# Patient Record
Sex: Female | Born: 1950 | Race: White | Hispanic: No | Smoking: Never smoker
Health system: Southern US, Community
[De-identification: ages and names within clinical notes are randomized; demographics above are authoritative.]

## PROBLEM LIST (undated history)

## (undated) DIAGNOSIS — Z9889 Other specified postprocedural states: Secondary | ICD-10-CM

## (undated) DIAGNOSIS — M1990 Unspecified osteoarthritis, unspecified site: Secondary | ICD-10-CM

## (undated) DIAGNOSIS — F419 Anxiety disorder, unspecified: Secondary | ICD-10-CM

## (undated) DIAGNOSIS — R29818 Other symptoms and signs involving the nervous system: Secondary | ICD-10-CM

## (undated) DIAGNOSIS — M25552 Pain in left hip: Secondary | ICD-10-CM

## (undated) DIAGNOSIS — D649 Anemia, unspecified: Secondary | ICD-10-CM

## (undated) DIAGNOSIS — M25551 Pain in right hip: Secondary | ICD-10-CM

## (undated) DIAGNOSIS — E039 Hypothyroidism, unspecified: Secondary | ICD-10-CM

## (undated) DIAGNOSIS — Z87442 Personal history of urinary calculi: Secondary | ICD-10-CM

## (undated) DIAGNOSIS — I10 Essential (primary) hypertension: Secondary | ICD-10-CM

## (undated) DIAGNOSIS — F32A Depression, unspecified: Secondary | ICD-10-CM

## (undated) DIAGNOSIS — H269 Unspecified cataract: Secondary | ICD-10-CM

## (undated) DIAGNOSIS — M7032 Other bursitis of elbow, left elbow: Secondary | ICD-10-CM

## (undated) DIAGNOSIS — R7989 Other specified abnormal findings of blood chemistry: Secondary | ICD-10-CM

## (undated) DIAGNOSIS — C679 Malignant neoplasm of bladder, unspecified: Secondary | ICD-10-CM

## (undated) DIAGNOSIS — S82891A Other fracture of right lower leg, initial encounter for closed fracture: Secondary | ICD-10-CM

## (undated) DIAGNOSIS — R112 Nausea with vomiting, unspecified: Secondary | ICD-10-CM

## (undated) DIAGNOSIS — N390 Urinary tract infection, site not specified: Secondary | ICD-10-CM

## (undated) DIAGNOSIS — R519 Headache, unspecified: Secondary | ICD-10-CM

## (undated) DIAGNOSIS — G4700 Insomnia, unspecified: Secondary | ICD-10-CM

## (undated) DIAGNOSIS — M7070 Other bursitis of hip, unspecified hip: Secondary | ICD-10-CM

## (undated) DIAGNOSIS — F329 Major depressive disorder, single episode, unspecified: Secondary | ICD-10-CM

## (undated) HISTORY — DX: Anxiety disorder, unspecified: F41.9

## (undated) HISTORY — PX: BLADDER SURGERY: SHX569

## (undated) HISTORY — DX: Major depressive disorder, single episode, unspecified: F32.9

## (undated) HISTORY — PX: COLONOSCOPY: SHX174

## (undated) HISTORY — PX: CYSTOSCOPY: SUR368

## (undated) HISTORY — PX: VAGINAL HYSTERECTOMY: SUR661

## (undated) HISTORY — DX: Urinary tract infection, site not specified: N39.0

## (undated) HISTORY — DX: Depression, unspecified: F32.A

## (undated) HISTORY — DX: Essential (primary) hypertension: I10

---

## 1975-04-21 HISTORY — PX: APPENDECTOMY: SHX54

## 1980-04-20 HISTORY — PX: OVARIAN CYST REMOVAL: SHX89

## 2001-04-20 HISTORY — PX: FINGER SURGERY: SHX640

## 2003-04-21 HISTORY — PX: LASIK: SHX215

## 2010-06-11 ENCOUNTER — Other Ambulatory Visit (HOSPITAL_COMMUNITY)
Admission: RE | Admit: 2010-06-11 | Discharge: 2010-06-11 | Disposition: A | Discharge status: Home / Self Care | Payer: BC Managed Care – PPO | Source: Ambulatory Visit | Attending: Family Medicine | Admitting: Family Medicine

## 2010-06-11 DIAGNOSIS — Z0000 Encounter for general adult medical examination without abnormal findings: Secondary | ICD-10-CM | POA: Insufficient documentation

## 2010-11-11 ENCOUNTER — Ambulatory Visit (HOSPITAL_BASED_OUTPATIENT_CLINIC_OR_DEPARTMENT_OTHER)
Admission: RE | Admit: 2010-11-11 | Discharge: 2010-11-11 | Disposition: A | Discharge status: Home / Self Care | Payer: BC Managed Care – PPO | Source: Ambulatory Visit | Attending: Urology | Admitting: Urology

## 2010-11-11 ENCOUNTER — Other Ambulatory Visit: Payer: Self-pay | Admitting: Urology

## 2010-11-11 DIAGNOSIS — C672 Malignant neoplasm of lateral wall of bladder: Secondary | ICD-10-CM | POA: Insufficient documentation

## 2010-11-11 DIAGNOSIS — Z01812 Encounter for preprocedural laboratory examination: Secondary | ICD-10-CM | POA: Insufficient documentation

## 2010-11-11 DIAGNOSIS — Z01810 Encounter for preprocedural cardiovascular examination: Secondary | ICD-10-CM | POA: Insufficient documentation

## 2010-11-11 LAB — POCT I-STAT 4, (NA,K, GLUC, HGB,HCT)
Glucose, Bld: 101 mg/dL — ABNORMAL HIGH (ref 70–99)
HCT: 39 % (ref 36.0–46.0)
Sodium: 141 mEq/L (ref 135–145)

## 2010-12-03 NOTE — Op Note (Signed)
  Sheri Mueller, Sheri Mueller                 ACCOUNT NO.:  [id number]  MEDICAL RECORD NO.:  [id number]  LOCATION:                                 FACILITY:  PHYSICIAN:  Danae Chen, M.D.  DATE OF BIRTH:  1950/06/09  DATE OF PROCEDURE:  11/11/2010 DATE OF DISCHARGE:                              OPERATIVE REPORT   PREOPERATIVE DIAGNOSIS:  Bladder tumor.  POSTOPERATIVE DIAGNOSIS:  Bladder tumor.  PROCEDURE:  Cystoscopy, transurethral resection of bladder tumor.  SURGEON:  Danae Chen, MD  ANESTHESIA:  General.  INDICATION:  The patient is a 60 year old female who had gross painless hematuria about 6 weeks ago.  The CT scan showed bilateral renal calculi.  Cystoscopy showed a small papillary tumor on the left lateral wall of the bladder.  She gave a history of benign bladder tumor several years ago.  She is scheduled today for cystoscopy and TUR bladder tumor.  DESCRIPTION OF PROCEDURE:  The patient was identified by her wrist band and proper time-out was taken.  Under general anesthesia, she was prepped and draped and placed in the dorsal lithotomy position.  A panendoscope was inserted in the bladder. There was a small tumor that measures about 2 cm on the left lateral wall of the bladder, lateral to the left ureteral orifice.  There is no evidence of other tumor in the bladder.  The ureteral orifices are in normal position and shape.  The cystoscope was removed.  A resectoscope was inserted in the bladder and resection of the bladder tumor was done. The tumor appears to be superficial.  Resection of the base of the bladder tumor was also done.  The specimen was then removed out of the bladder.  The margin of resection was also fulgurated as well as the base of the bladder tumor.  There was no evidence of bleeding at the end of the procedure.  The resectoscope was then removed.  A #20 French Foley catheter was inserted in the bladder.  The patient tolerated the procedure well and  left the OR in satisfactory condition to postanesthesia care unit.     Danae Chen, M.D.     MN/MEDQ  D:  11/11/2010  T:  11/11/2010  Job:  [id number]  cc:   Dario Guardian, M.D. Fax: [phone]  Electronically Signed by Lindaann Slough M.D. on 12/03/2010 11:27:16 AM

## 2012-08-09 ENCOUNTER — Encounter: Payer: Self-pay | Admitting: Diagnostic Neuroimaging

## 2012-08-09 ENCOUNTER — Ambulatory Visit (INDEPENDENT_AMBULATORY_CARE_PROVIDER_SITE_OTHER): Payer: BC Managed Care – PPO | Admitting: Diagnostic Neuroimaging

## 2012-08-09 VITALS — BP 126/68 | HR 76 | Temp 98.0°F | Ht 66.0 in | Wt 144.5 lb

## 2012-08-09 DIAGNOSIS — R6889 Other general symptoms and signs: Secondary | ICD-10-CM

## 2012-08-09 DIAGNOSIS — L738 Other specified follicular disorders: Secondary | ICD-10-CM

## 2012-08-09 DIAGNOSIS — R209 Unspecified disturbances of skin sensation: Secondary | ICD-10-CM

## 2012-08-09 DIAGNOSIS — L853 Xerosis cutis: Secondary | ICD-10-CM

## 2012-08-09 NOTE — Progress Notes (Signed)
[facility]  PATIENT: Sheri Mueller DOB: Oct 17, 1950  REFERRING CLINICIAN: C Smaith HISTORY FROM: patient REASON FOR VISIT: new consult   HISTORICAL  CHIEF COMPLAINT:  Chief Complaint  Patient presents with  . Numbness    skin    HISTORY OF PRESENT ILLNESS:   62 year old right-handed female with prior history of hypertension, UTIs, prior depression anxiety, bladder cancer, here for evaluation of dry skin and low body temperature.  2 years ago patient received laser skin treatment, and developed abnormal marks on her skin. One year ago patient was at a massage therapist, who noted that the patient had dry skin and muscle tension. Patient also had some pain in her groin and hip regions.  By Christmas of 2013, patient was having abnormal sensations in her face with twisting and pulling sensation. Initially this was continuous, but now has become intermittent lasting for minutes or hours at a time.  Patient has seen endocrinology, had extensive blood testing without a specific diagnosis. Patient also noted to have low temperatures at home with a mercury thermometer orally.  REVIEW OF SYSTEMS: Full 14 system review of systems performed and notable only for fatigue, ringing in the ears, rash, itching, feeling cold, joint pain, not enough sleep, insomnia.  ALLERGIES: No Known Allergies  HOME MEDICATIONS: No outpatient prescriptions prior to visit.   No facility-administered medications prior to visit.    PAST MEDICAL HISTORY: Past Medical History  Diagnosis Date  . Urinary tract infection   . Hypertension   . Anxiety and depression   . Cancer     Bladder    PAST SURGICAL HISTORY: Past Surgical History  Procedure Laterality Date  . Appendectomy  1977  . Ovarian cyst removal  1982  . Vaginal hysterectomy  1990's  . Finger surgery  2003    dupuytren's contracture  . Bladder surgery      tumor removal    FAMILY HISTORY: Family History  Problem  Relation Age of Onset  . Congestive Heart Failure Maternal Grandmother   . Congestive Heart Failure Mother     SOCIAL HISTORY:  History   Social History  . Marital Status: Divorced    Spouse Name: N/A    Number of Children: 2  . Years of Education: college   Occupational History  . caregiver    Social History Main Topics  . Smoking status: Never Smoker   . Smokeless tobacco: Not on file  . Alcohol Use: Yes     Comment: 1 glass of red wine nightly  . Drug Use: No  . Sexually Active: Not on file   Other Topics Concern  . Not on file   Social History Narrative   Pt lives at home alone.    Caffeine Use- 1 cup in a.m. (1/2 caffeinated)     PHYSICAL EXAM  Filed Vitals:   08/09/12 1008  BP: 126/68  Pulse: 76  Temp: 98 F (36.7 C)  TempSrc: Oral  Height: 5\' 6"  (1.676 m)  Weight: 144 lb 8 oz (65.545 kg)   Body mass index is 23.33 kg/(m^2).  GENERAL EXAM: Patient is in no distress  CARDIOVASCULAR: Regular rate and rhythm, no murmurs, no carotid bruits  NEUROLOGIC: MENTAL STATUS: awake, alert, language fluent, comprehension intact, naming intact CRANIAL NERVE: no papilledema on fundoscopic exam, pupils equal and reactive to light, visual fields full to confrontation, extraocular muscles intact, no nystagmus, facial sensation and strength symmetric, uvula midline, shoulder shrug symmetric, tongue midline. MOTOR: normal bulk and tone,  full strength in the BUE, BLE SENSORY: normal and symmetric to light touch, pinprick, temperature, vibration and proprioception COORDINATION: finger-nose-finger, fine finger movements normal REFLEXES: deep tendon reflexes present and symmetric; TRACE AT ANKLES. GAIT/STATION: narrow based gait; able to walk on toes, heels and tandem; romberg is negative   DIAGNOSTIC DATA (LABS, IMAGING, TESTING) - I reviewed patient records, labs, notes, testing and imaging myself where available.  Lab Results  Component Value Date   HGB 13.3  11/11/2010   HCT 39.0 11/11/2010      Component Value Date/Time   NA 141 11/11/2010 0907   K 3.4* 11/11/2010 0907   GLUCOSE 101* 11/11/2010 0907   No results found for this basename: CHOL, HDL, LDLCALC, LDLDIRECT, TRIG, CHOLHDL   No results found for this basename: HGBA1C   No results found for this basename: VITAMINB12   No results found for this basename: TSH    SSA/SSB - < 0.2 ESR 6 FT4 0.85 TSH 2.70   ASSESSMENT AND PLAN  62 y.o. year old female  has a past medical history of Urinary tract infection; Hypertension; Anxiety and depression; and Cancer. here with dry skin and low temperature / cold intolerance. Unclear neurologic etiology. I would recommend MRI brain to rule out intracranial abnormality (inflammation, pituitary lesion, vascular). She wants to hold off for now. Also should check B12 level, which can be done at PCP office.  PLAN: 1. MRI brain 2. B12 level     Suanne Marker, MD 08/09/2012, 11:15 AM Certified in Neurology, Neurophysiology and Neuroimaging  [facility] [address], [address] [place], [place] [address] [phone]

## 2012-08-09 NOTE — Patient Instructions (Addendum)
I recommend checking MRI brain and B12 level. Let us know if you want to proceed with MRI brain. Your PCP can check B12 level at next lab draw.

## 2012-12-06 ENCOUNTER — Other Ambulatory Visit: Payer: Self-pay | Admitting: Gastroenterology

## 2013-03-30 ENCOUNTER — Telehealth: Payer: Self-pay | Admitting: Oncology

## 2013-03-30 NOTE — Telephone Encounter (Signed)
C/D 03/30/13 for appt. 05/03/12

## 2013-03-30 NOTE — Telephone Encounter (Signed)
S/W PT AND GVE NP APPT 1/14 @ 1:30 W/DR. SHADAD REFERRING DR. Merri Brunette  DX-INC'D FERRITIN WELCOME PACKET MAILED.

## 2013-03-30 NOTE — Telephone Encounter (Signed)
LVOM FOR PT TO RETURN CALL IN RE TO REFERRAL.  °

## 2013-04-28 ENCOUNTER — Other Ambulatory Visit: Payer: Self-pay | Admitting: Oncology

## 2013-05-03 ENCOUNTER — Encounter: Payer: Self-pay | Admitting: Oncology

## 2013-05-03 ENCOUNTER — Other Ambulatory Visit (HOSPITAL_BASED_OUTPATIENT_CLINIC_OR_DEPARTMENT_OTHER): Payer: BC Managed Care – PPO

## 2013-05-03 ENCOUNTER — Ambulatory Visit (HOSPITAL_BASED_OUTPATIENT_CLINIC_OR_DEPARTMENT_OTHER): Payer: BC Managed Care – PPO | Admitting: Oncology

## 2013-05-03 ENCOUNTER — Ambulatory Visit (HOSPITAL_BASED_OUTPATIENT_CLINIC_OR_DEPARTMENT_OTHER): Payer: BC Managed Care – PPO

## 2013-05-03 DIAGNOSIS — M6289 Other specified disorders of muscle: Secondary | ICD-10-CM

## 2013-05-03 DIAGNOSIS — I10 Essential (primary) hypertension: Secondary | ICD-10-CM

## 2013-05-03 DIAGNOSIS — R234 Changes in skin texture: Secondary | ICD-10-CM

## 2013-05-03 LAB — CBC WITH DIFFERENTIAL/PLATELET
BASO%: 0.6 % (ref 0.0–2.0)
Basophils Absolute: 0.1 10*3/uL (ref 0.0–0.1)
EOS%: 2.7 % (ref 0.0–7.0)
Eosinophils Absolute: 0.2 10*3/uL (ref 0.0–0.5)
HEMATOCRIT: 40.3 % (ref 34.8–46.6)
HGB: 13.6 g/dL (ref 11.6–15.9)
LYMPH#: 1.8 10*3/uL (ref 0.9–3.3)
LYMPH%: 19.6 % (ref 14.0–49.7)
MCH: 29.1 pg (ref 25.1–34.0)
MCHC: 33.7 g/dL (ref 31.5–36.0)
MCV: 86.4 fL (ref 79.5–101.0)
MONO#: 0.5 10*3/uL (ref 0.1–0.9)
MONO%: 6 % (ref 0.0–14.0)
NEUT#: 6.6 10*3/uL — ABNORMAL HIGH (ref 1.5–6.5)
NEUT%: 71.1 % (ref 38.4–76.8)
Platelets: 221 10*3/uL (ref 145–400)
RBC: 4.66 10*6/uL (ref 3.70–5.45)
RDW: 14.1 % (ref 11.2–14.5)
WBC: 9.2 10*3/uL (ref 3.9–10.3)

## 2013-05-03 LAB — COMPREHENSIVE METABOLIC PANEL (CC13)
ALBUMIN: 3.8 g/dL (ref 3.5–5.0)
ALT: 16 U/L (ref 0–55)
ANION GAP: 8 meq/L (ref 3–11)
AST: 19 U/L (ref 5–34)
Alkaline Phosphatase: 53 U/L (ref 40–150)
BUN: 16.4 mg/dL (ref 7.0–26.0)
CALCIUM: 9.1 mg/dL (ref 8.4–10.4)
CHLORIDE: 106 meq/L (ref 98–109)
CO2: 25 meq/L (ref 22–29)
CREATININE: 0.7 mg/dL (ref 0.6–1.1)
Glucose: 87 mg/dl (ref 70–140)
POTASSIUM: 4.2 meq/L (ref 3.5–5.1)
SODIUM: 140 meq/L (ref 136–145)
TOTAL PROTEIN: 6.6 g/dL (ref 6.4–8.3)
Total Bilirubin: 0.41 mg/dL (ref 0.20–1.20)

## 2013-05-03 LAB — FERRITIN CHCC: Ferritin: 37 ng/ml (ref 9–269)

## 2013-05-03 LAB — IRON AND TIBC CHCC
%SAT: 23 % (ref 21–57)
Iron: 79 ug/dL (ref 41–142)
TIBC: 344 ug/dL (ref 236–444)
UIBC: 265 ug/dL (ref 120–384)

## 2013-05-03 NOTE — Consult Note (Signed)
Reason for Referral: Elevated ferritin.   HPI: 63 year old woman native of [place] nephrectomy for the evaluation of elevated ferritin. She is a pleasant woman who has been rather healthy for the majority of her adult life. In the last year or so she have noticed symptoms of muscle stiffness and skin changes which prompted that laboratory testing that was done in August of 2014. It was noted that she had an elevated ferritin of 190. She had normal hemoglobin and hematocrit. She had normal platelet count and white cell count. She had normal liver function tests and electrolytes. She has a brother that was diagnosed with hemochromatosis and currently not receiving phlebotomies but have in the past. She reports no other complications or symptoms at this time. She had donated blood on a few occasions and relatively asymptomatic. She has not reported any weakness fatigue or tiredness. Has not reported any limitation in her blood sugar or mood changes. She has reported right hip pain which is have been that was with physical therapy. She remains very active and cares for her elderly parents.   Past Medical History  Diagnosis Date  . Urinary tract infection   . Hypertension   . Anxiety and depression   . Cancer     Bladder  :  Past Surgical History  Procedure Laterality Date  . Appendectomy  1977  . Ovarian cyst removal  1982  . Vaginal hysterectomy  1990's  . Finger surgery  2003    dupuytren's contracture  . Bladder surgery      tumor removal  : Current Outpatient Prescriptions  Medication Sig Dispense Refill  . losartan-hydrochlorothiazide (HYZAAR) 100-25 MG per tablet Take 1 tablet by mouth daily.      Marland Kitchen NATURE-THROID 97.5 MG TABS Take 1 tablet by mouth daily.       No current facility-administered medications for this visit.    No Known Allergies:  Family History  Problem Relation Age of Onset  . Congestive Heart Failure Maternal Grandmother   . Congestive Heart Failure Mother    :  History   Social History  . Marital Status: Divorced    Spouse Name: N/A    Number of Children: 2  . Years of Education: college   Occupational History  . caregiver    Social History Main Topics  . Smoking status: Never Smoker   . Smokeless tobacco: Not on file  . Alcohol Use: Yes     Comment: 1 glass of red wine nightly  . Drug Use: No  . Sexual Activity: Not on file   Other Topics Concern  . Not on file   Social History Narrative   Pt lives at home alone.    Caffeine Use- 1 cup in a.m. (1/2 caffeinated)  :  Constitutional: negative for anorexia, chills and fatigue Eyes: negative for icterus, irritation and redness Ears, nose, mouth, throat, and face: negative for epistaxis, hoarseness and nasal congestion Respiratory: negative for asthma, cough and dyspnea on exertion Cardiovascular: negative for chest pain, chest pressure/discomfort and exertional chest pressure/discomfort Gastrointestinal: negative for abdominal pain, diarrhea and jaundice Genitourinary:negative for frequency, hematuria and nocturia Integument/breast: negative for pruritus and rash Hematologic/lymphatic: negative for bleeding, easy bruising and lymphadenopathy Musculoskeletal:negative for arthralgias Neurological: negative for coordination problems, dizziness and gait problems Behavioral/Psych: negative for depression Endocrine: No heat or cold intolerance.   Exam: ECOG 0 Blood pressure 150/54, pulse 82, temperature 98 F (36.7 C), temperature source Oral, resp. rate 18, height 5\' 6"  (1.676 m), weight  147 lb (66.679 kg). General appearance: alert, cooperative and appears stated age Head: Normocephalic, without obvious abnormality, atraumatic Nose: Nares normal. Septum midline. Mucosa normal. No drainage or sinus tenderness. Throat: lips, mucosa, and tongue normal; teeth and gums normal Neck: no adenopathy, no carotid bruit, no JVD, supple, symmetrical, trachea midline and thyroid not  enlarged, symmetric, no tenderness/mass/nodules Back: symmetric, no curvature. ROM normal. No CVA tenderness. Resp: clear to auscultation bilaterally Chest wall: no tenderness Cardio: regular rate and rhythm, S1, S2 normal, no murmur, click, rub or gallop GI: soft, non-tender; bowel sounds normal; no masses,  no organomegaly Extremities: extremities normal, atraumatic, no cyanosis or edema Pulses: 2+ and symmetric Skin: Skin color, texture, turgor normal. No rashes or lesions Lymph nodes: Cervical, supraclavicular, and axillary nodes normal. Neurologic: Grossly normal   Recent Labs  05/03/13 1358  WBC 9.2  HGB 13.6  HCT 40.3  PLT 221     Assessment and Plan:   63 year old woman with the following issues:  1. Elevated ferritin of 190 with the upper limit of normal of 150. The differential diagnosis was discussed today with the patient in detail. Elevated ferritin can be due to chronic or acute inflammatory conditions could also be due to dietary intake could also be a sign of iron metabolism. She could have a milder form of hemochromatosis that could be manifesting itself with the elevated ferritin. Given the fact that she is asymptomatic without any liver, cardiac or hematological abnormalities I see no clear-cut need for any intervention. I will repeat her ferritin level and certainly if her ferritin continue to rise we will consider checking her for hemochromatosis genetic testing. Unless she develops continuous rise in her ferritin and endorgan damage I see no reason to put her on any routine phlebotomies. I continue to encourage her to donate blood as she is doing.  2. Skin changes and muscle stiffness: I doubt this is related to her elevated ferritin given the fact that her ferritin levels are relatively mild and do not constitute or explain her symptoms at this time. I explained to her that she might need an evaluation by rheumatology for possible autoimmune disorder or  fibromyalgia.

## 2013-05-03 NOTE — Progress Notes (Signed)
Please see consult note.  

## 2013-05-03 NOTE — Progress Notes (Signed)
Mailed updated medication list to patient's home 

## 2013-05-04 ENCOUNTER — Telehealth: Payer: Self-pay | Admitting: *Deleted

## 2013-05-04 NOTE — Telephone Encounter (Signed)
Spoke with patient, re: labs and mailed copy to her home.

## 2013-05-04 NOTE — Telephone Encounter (Signed)
Message copied by Randolm Idol on Thu May 04, 2013  8:50 AM ------      Message from: Wyatt Portela      Created: Wed May 03, 2013  3:28 PM       Please call her Ferritin level ([phone]). Normal and no further testing is needed. ------

## 2013-06-20 DIAGNOSIS — N951 Menopausal and female climacteric states: Secondary | ICD-10-CM | POA: Diagnosis not present

## 2013-06-20 DIAGNOSIS — E039 Hypothyroidism, unspecified: Secondary | ICD-10-CM | POA: Diagnosis not present

## 2013-06-28 DIAGNOSIS — J305 Allergic rhinitis due to food: Secondary | ICD-10-CM | POA: Diagnosis not present

## 2013-06-28 DIAGNOSIS — T7809XA Anaphylactic reaction due to other food products, initial encounter: Secondary | ICD-10-CM | POA: Diagnosis not present

## 2013-06-28 DIAGNOSIS — K5229 Other allergic and dietetic gastroenteritis and colitis: Secondary | ICD-10-CM | POA: Diagnosis not present

## 2013-08-02 DIAGNOSIS — M79609 Pain in unspecified limb: Secondary | ICD-10-CM | POA: Diagnosis not present

## 2013-08-02 DIAGNOSIS — R5383 Other fatigue: Secondary | ICD-10-CM | POA: Diagnosis not present

## 2013-08-02 DIAGNOSIS — R5381 Other malaise: Secondary | ICD-10-CM | POA: Diagnosis not present

## 2013-08-10 DIAGNOSIS — M79609 Pain in unspecified limb: Secondary | ICD-10-CM | POA: Diagnosis not present

## 2013-08-16 DIAGNOSIS — M79609 Pain in unspecified limb: Secondary | ICD-10-CM | POA: Diagnosis not present

## 2013-08-31 DIAGNOSIS — M79609 Pain in unspecified limb: Secondary | ICD-10-CM | POA: Diagnosis not present

## 2013-09-06 DIAGNOSIS — R5383 Other fatigue: Secondary | ICD-10-CM | POA: Diagnosis not present

## 2013-09-06 DIAGNOSIS — R5381 Other malaise: Secondary | ICD-10-CM | POA: Diagnosis not present

## 2013-09-20 DIAGNOSIS — M549 Dorsalgia, unspecified: Secondary | ICD-10-CM | POA: Diagnosis not present

## 2013-09-20 DIAGNOSIS — I10 Essential (primary) hypertension: Secondary | ICD-10-CM | POA: Diagnosis not present

## 2013-09-21 DIAGNOSIS — M79609 Pain in unspecified limb: Secondary | ICD-10-CM | POA: Diagnosis not present

## 2013-09-25 DIAGNOSIS — R928 Other abnormal and inconclusive findings on diagnostic imaging of breast: Secondary | ICD-10-CM | POA: Diagnosis not present

## 2013-09-25 DIAGNOSIS — Z1231 Encounter for screening mammogram for malignant neoplasm of breast: Secondary | ICD-10-CM | POA: Diagnosis not present

## 2013-10-11 DIAGNOSIS — R5383 Other fatigue: Secondary | ICD-10-CM | POA: Diagnosis not present

## 2013-10-11 DIAGNOSIS — R5381 Other malaise: Secondary | ICD-10-CM | POA: Diagnosis not present

## 2013-10-12 DIAGNOSIS — M79609 Pain in unspecified limb: Secondary | ICD-10-CM | POA: Diagnosis not present

## 2013-11-06 DIAGNOSIS — M79609 Pain in unspecified limb: Secondary | ICD-10-CM | POA: Diagnosis not present

## 2013-11-21 DIAGNOSIS — M79609 Pain in unspecified limb: Secondary | ICD-10-CM | POA: Diagnosis not present

## 2013-12-05 DIAGNOSIS — M79609 Pain in unspecified limb: Secondary | ICD-10-CM | POA: Diagnosis not present

## 2013-12-26 DIAGNOSIS — G4700 Insomnia, unspecified: Secondary | ICD-10-CM | POA: Diagnosis not present

## 2013-12-26 DIAGNOSIS — M79609 Pain in unspecified limb: Secondary | ICD-10-CM | POA: Diagnosis not present

## 2013-12-26 DIAGNOSIS — N390 Urinary tract infection, site not specified: Secondary | ICD-10-CM | POA: Diagnosis not present

## 2013-12-26 DIAGNOSIS — R300 Dysuria: Secondary | ICD-10-CM | POA: Diagnosis not present

## 2014-01-11 DIAGNOSIS — M79609 Pain in unspecified limb: Secondary | ICD-10-CM | POA: Diagnosis not present

## 2014-01-26 DIAGNOSIS — M79609 Pain in unspecified limb: Secondary | ICD-10-CM | POA: Diagnosis not present

## 2014-02-06 DIAGNOSIS — M79609 Pain in unspecified limb: Secondary | ICD-10-CM | POA: Diagnosis not present

## 2014-02-08 DIAGNOSIS — E782 Mixed hyperlipidemia: Secondary | ICD-10-CM | POA: Diagnosis not present

## 2014-02-08 DIAGNOSIS — E039 Hypothyroidism, unspecified: Secondary | ICD-10-CM | POA: Diagnosis not present

## 2014-02-08 DIAGNOSIS — R7989 Other specified abnormal findings of blood chemistry: Secondary | ICD-10-CM | POA: Diagnosis not present

## 2014-02-08 DIAGNOSIS — E559 Vitamin D deficiency, unspecified: Secondary | ICD-10-CM | POA: Diagnosis not present

## 2014-02-20 DIAGNOSIS — C679 Malignant neoplasm of bladder, unspecified: Secondary | ICD-10-CM | POA: Diagnosis not present

## 2014-02-27 DIAGNOSIS — E039 Hypothyroidism, unspecified: Secondary | ICD-10-CM | POA: Diagnosis not present

## 2014-03-12 DIAGNOSIS — C679 Malignant neoplasm of bladder, unspecified: Secondary | ICD-10-CM | POA: Diagnosis not present

## 2014-03-12 DIAGNOSIS — N200 Calculus of kidney: Secondary | ICD-10-CM | POA: Diagnosis not present

## 2014-03-22 DIAGNOSIS — Z23 Encounter for immunization: Secondary | ICD-10-CM | POA: Diagnosis not present

## 2014-03-27 DIAGNOSIS — I10 Essential (primary) hypertension: Secondary | ICD-10-CM | POA: Diagnosis not present

## 2014-03-27 DIAGNOSIS — E039 Hypothyroidism, unspecified: Secondary | ICD-10-CM | POA: Diagnosis not present

## 2016-07-15 ENCOUNTER — Other Ambulatory Visit: Payer: Self-pay | Admitting: General Surgery

## 2016-07-15 DIAGNOSIS — N824 Other female intestinal-genital tract fistulae: Secondary | ICD-10-CM

## 2016-07-29 ENCOUNTER — Other Ambulatory Visit: Payer: Self-pay

## 2016-10-15 ENCOUNTER — Other Ambulatory Visit: Payer: Self-pay | Admitting: Family Medicine

## 2016-10-15 DIAGNOSIS — M79662 Pain in left lower leg: Secondary | ICD-10-CM

## 2016-10-16 ENCOUNTER — Ambulatory Visit
Admission: RE | Admit: 2016-10-16 | Discharge: 2016-10-16 | Disposition: A | Discharge status: Home / Self Care | Payer: 59 | Source: Ambulatory Visit | Attending: Family Medicine | Admitting: Family Medicine

## 2016-10-16 DIAGNOSIS — M79662 Pain in left lower leg: Secondary | ICD-10-CM

## 2017-01-27 ENCOUNTER — Encounter (HOSPITAL_BASED_OUTPATIENT_CLINIC_OR_DEPARTMENT_OTHER): Payer: Self-pay

## 2017-01-27 ENCOUNTER — Encounter: Payer: Self-pay | Admitting: Family Medicine

## 2017-01-27 ENCOUNTER — Emergency Department (HOSPITAL_BASED_OUTPATIENT_CLINIC_OR_DEPARTMENT_OTHER): Payer: Medicare Other

## 2017-01-27 ENCOUNTER — Emergency Department (HOSPITAL_BASED_OUTPATIENT_CLINIC_OR_DEPARTMENT_OTHER)
Admission: EM | Admit: 2017-01-27 | Discharge: 2017-01-27 | Disposition: A | Discharge status: Home / Self Care | Payer: Medicare Other | Attending: Emergency Medicine | Admitting: Emergency Medicine

## 2017-01-27 ENCOUNTER — Ambulatory Visit (INDEPENDENT_AMBULATORY_CARE_PROVIDER_SITE_OTHER): Payer: Medicare Other | Admitting: Family Medicine

## 2017-01-27 DIAGNOSIS — S99911A Unspecified injury of right ankle, initial encounter: Secondary | ICD-10-CM | POA: Diagnosis present

## 2017-01-27 DIAGNOSIS — I10 Essential (primary) hypertension: Secondary | ICD-10-CM | POA: Diagnosis not present

## 2017-01-27 DIAGNOSIS — Y929 Unspecified place or not applicable: Secondary | ICD-10-CM | POA: Insufficient documentation

## 2017-01-27 DIAGNOSIS — X509XXA Other and unspecified overexertion or strenuous movements or postures, initial encounter: Secondary | ICD-10-CM | POA: Insufficient documentation

## 2017-01-27 DIAGNOSIS — S82891A Other fracture of right lower leg, initial encounter for closed fracture: Secondary | ICD-10-CM | POA: Insufficient documentation

## 2017-01-27 DIAGNOSIS — Y939 Activity, unspecified: Secondary | ICD-10-CM | POA: Diagnosis not present

## 2017-01-27 DIAGNOSIS — Z8551 Personal history of malignant neoplasm of bladder: Secondary | ICD-10-CM | POA: Diagnosis not present

## 2017-01-27 DIAGNOSIS — Y999 Unspecified external cause status: Secondary | ICD-10-CM | POA: Insufficient documentation

## 2017-01-27 HISTORY — DX: Other fracture of right lower leg, initial encounter for closed fracture: S82.891A

## 2017-01-27 NOTE — ED Provider Notes (Signed)
[place] DEPT [facility] Provider Note   CSN: [id number] Arrival date & time: 01/27/17  1210     History   Chief Complaint Chief Complaint  Patient presents with  . Ankle Injury    HPI Sheri Mueller is a 66 y.o. female.  Patient fell and twisted her right ankle. Patient brought in by friend. Patient without any other significant injuries with the fall other than she has some minor abrasions to her knees and hands. Did not hit her head. No neck pain no back pain no proximal leg pain no hip pain. Fall occurred just prior to arrival.      Past Medical History:  Diagnosis Date  . Anxiety and depression   . Cancer [facility])    Bladder  . Hypertension   . Urinary tract infection     There are no active problems to display for this patient.   Past Surgical History:  Procedure Laterality Date  . APPENDECTOMY  1977  . BLADDER SURGERY     tumor removal  . FINGER SURGERY  2003   dupuytren's contracture  . OVARIAN CYST REMOVAL  1982  . VAGINAL HYSTERECTOMY  1990's    OB History    No data available       Home Medications    Prior to Admission medications   Medication Sig Start Date End Date Taking? Authorizing Provider  Cholecalciferol (VITAMIN D3) 2000 UNITS TABS Take 2 tablets by mouth daily.    Yes [provider]  Flax OIL Take 15 mLs by mouth daily.   Yes [provider]  LORazepam (ATIVAN) 0.5 MG tablet Take 0.5 mg by mouth at bedtime.   Yes [provider]  losartan (COZAAR) 100 MG tablet Take 100 mg by mouth daily.   Yes [provider]  thyroid (NP THYROID) 15 MG tablet Take 15 mg by mouth daily.   Yes [provider]  UNABLE TO FIND Med Name:estradiol-testosterone-progesterone   Yes [provider]  calcium citrate-vitamin D (CITRACAL+D) 315-200 MG-UNIT per tablet Take 1 tablet by mouth 2 (two) times daily. 630 mg    [provider]  COCONUT OIL PO Take 15 mLs by mouth daily.    [provider]  IODOQUINOL PO Take 1 tablet by mouth daily.    [provider]  LIOTHYRONINE SODIUM PO Take 5 mg by mouth 2 (two) times daily.    [provider]  losartan-hydrochlorothiazide (HYZAAR) 100-25 MG per tablet Take 1 tablet by mouth daily.    [provider]  Magnesium 100 MG CAPS Take 1 capsule by mouth daily.    [provider]  MILK THISTLE PO Take 1 capsule by mouth daily.    [provider]  Misc Natural Products ([employer] GLUCOS-CHONDROIT-MSM TS PO) Take 1 tablet by mouth daily.    [provider]  NATURE-THROID 97.5 MG TABS Take 1 tablet by mouth daily. 04/21/13   [provider]  NON FORMULARY Take 1 tablet by mouth daily. collatrim    [provider]  Progesterone Micronized (PROGESTERONE PO) Take 150 mg by mouth 2 (two) times daily.    [provider]  Selenium (SELENIMIN PO) Take 100 mcg by mouth daily.    [provider]  testosterone (ANDRODERM) 4 MG/24HR PT24 patch Place 1 patch onto the skin daily. 4 %    [provider]  UNABLE TO FIND Take 5 mg by mouth daily. Med Name:  biest    [provider]  Family History Family History  Problem Relation Age of Onset  . Congestive Heart Failure Mother   . Congestive Heart Failure Maternal Grandmother     Social History Social History  Substance Use Topics  . Smoking status: Never Smoker  . Smokeless tobacco: Never Used  . Alcohol use No     Allergies   Patient has no known allergies.   Review of Systems Review of Systems  Constitutional: Negative for fever.  HENT: Negative for congestion.   Eyes: Negative for visual disturbance.  Respiratory: Negative for shortness of breath.   Cardiovascular: Negative for chest pain.  Gastrointestinal: Negative for abdominal pain.  Genitourinary: Negative for dysuria.  Musculoskeletal: Positive for joint swelling. Negative for back pain and neck pain.  Neurological:  Negative for headaches.  Hematological: Does not bruise/bleed easily.  Psychiatric/Behavioral: Negative for confusion.     Physical Exam Updated Vital Signs BP 126/89 (BP Location: Left Arm)   Pulse 70   Temp 98.1 F (36.7 C) (Oral)   Resp 18   Ht 1.676 m (5\' 6" )   Wt 63 kg (139 lb)   SpO2 100%   BMI 22.44 kg/m   Physical Exam  Constitutional: She is oriented to person, place, and time. She appears well-developed and well-nourished. No distress.  HENT:  Head: Normocephalic and atraumatic.  Mouth/Throat: Oropharynx is clear and moist.  Eyes: Pupils are equal, round, and reactive to light. Conjunctivae and EOM are normal.  Neck: Normal range of motion. Neck supple.  Cardiovascular: Normal rate, regular rhythm and normal heart sounds.   Pulmonary/Chest: Effort normal and breath sounds normal. No respiratory distress.  Abdominal: Soft. Bowel sounds are normal. There is no tenderness.  Musculoskeletal: She exhibits edema and tenderness.  Swelling and tenderness to the lateral mild villous of the right ankle. No proximal leg tenderness. No medial tenderness. Dorsalis pedis pulses 1-2+. Sensation intact. Good movement of toes. Limited range of motion at the ankle. Good movement at the hips bilaterally. Nontender.  Superficial abrasions to both knees. And some on the hands.  Neurological: She is alert and oriented to person, place, and time. No cranial nerve deficit. She exhibits normal muscle tone. Coordination normal.  Skin: Skin is warm. No rash noted.  Nursing note and vitals reviewed.    ED Treatments / Results  Labs (all labs ordered are listed, but only abnormal results are displayed) Labs Reviewed - No data to display  EKG  EKG Interpretation None       Radiology Dg Ankle Complete Right  Result Date: 01/27/2017 CLINICAL DATA:  Lateral right ankle pain due to a slip and fall this morning. Initial encounter. EXAM: RIGHT ANKLE - COMPLETE 3+ VIEW COMPARISON:  None.  FINDINGS: The patient has a nondisplaced fracture of the lateral malleolus with associated soft tissue swelling. No other bony or joint abnormality is identified. IMPRESSION: Nondisplaced lateral malleolus fracture with associated soft tissue swelling. Electronically Signed   By: Inge Rise M.D.   On: 01/27/2017 12:50    Procedures Procedures (including critical care time)  Medications Ordered in ED Medications - No data to display   Initial Impression / Assessment and Plan / ED Course  I have reviewed the triage vital signs and the nursing notes.  Pertinent labs & imaging results that were available during my care of the patient were reviewed by me and considered in my medical decision making (see chart for details).    X-rays show a lateral malleolus fracture of the right ankle no  significant displacement. Clinically it's closed fracture. Treated with sugar tong splint and crutches and follow-up with sports medicine.  As it turned out ports medicine had an opening so they wanted to see her today. So patient was discharged from the emergency department to be evaluated by sports medicine upstairs. Therefore patient did not get a prescription for pain medicine. She did have her splint in place. Did have her crutches.   Final Clinical Impressions(s) / ED Diagnoses   Final diagnoses:  Closed fracture of right ankle, initial encounter    New Prescriptions New Prescriptions   No medications on file     Fredia Sorrow, MD 01/27/17 1501

## 2017-01-27 NOTE — ED Triage Notes (Signed)
Pt twisted right ankle this am-presents to triage in w/c-NAD

## 2017-01-27 NOTE — Discharge Instructions (Signed)
Use crutches as directed. Elevate the leg is much as possible. Keep the splint on in place. Rest of follow-up as per sports medicine.

## 2017-01-27 NOTE — Patient Instructions (Signed)
You have a distal fibula fracture below the level of the ankle joint. I'd expect this to heal well over 6 weeks. Wear the boot when up and walking around at all times. Ok to take this off when icing, washing area, and sleeping (if not too painful). Crutches if needed. Ok to do simple up/down ankle motion exercises twice a day. Ice 15 minutes at a time 3-4 times a day. Elevate above your heart as much as possible. Tylenol and/or aleve if needed for pain. Follow up with me in 1 week for reevaluation, repeat x-rays.

## 2017-01-27 NOTE — ED Notes (Signed)
1430- pt was taken directly to Dr. Ericka Pontiff office for evaluation per Nevin Bloodgood.

## 2017-01-28 DIAGNOSIS — S99911D Unspecified injury of right ankle, subsequent encounter: Secondary | ICD-10-CM | POA: Insufficient documentation

## 2017-01-28 NOTE — Progress Notes (Signed)
PCP: Carol Ada, MD  Subjective:   HPI: Patient is a 66 y.o. female here for right ankle injury.  Patient reports earlier today she was cleaning out her car when she accidentally inverted her right ankle on some acorns. Could bear weight after this but was limping. Pain level up to 7/10 and sharp lateral ankle. + swelling and bruising. Taken ibuprofen and using crutches now. Placed in stirrup splint in ED prior to arrival here. No other skin changes.  No numbness.  Past Medical History:  Diagnosis Date  . Anxiety and depression   . Cancer [facility])    Bladder  . Hypertension   . Urinary tract infection     Current Outpatient Prescriptions on File Prior to Visit  Medication Sig Dispense Refill  . calcium citrate-vitamin D (CITRACAL+D) 315-200 MG-UNIT per tablet Take 1 tablet by mouth 2 (two) times daily. 630 mg    . Cholecalciferol (VITAMIN D3) 2000 UNITS TABS Take 2 tablets by mouth daily.     . COCONUT OIL PO Take 15 mLs by mouth daily.    . Flax OIL Take 15 mLs by mouth daily.    . IODOQUINOL PO Take 1 tablet by mouth daily.    Marland Kitchen LIOTHYRONINE SODIUM PO Take 5 mg by mouth 2 (two) times daily.    Marland Kitchen LORazepam (ATIVAN) 0.5 MG tablet Take 0.5 mg by mouth at bedtime.    Marland Kitchen losartan (COZAAR) 100 MG tablet Take 100 mg by mouth daily.    Marland Kitchen losartan-hydrochlorothiazide (HYZAAR) 100-25 MG per tablet Take 1 tablet by mouth daily.    . Magnesium 100 MG CAPS Take 1 capsule by mouth daily.    Marland Kitchen MILK THISTLE PO Take 1 capsule by mouth daily.    . Misc Natural Products ([employer] GLUCOS-CHONDROIT-MSM TS PO) Take 1 tablet by mouth daily.    Marland Kitchen NATURE-THROID 97.5 MG TABS Take 1 tablet by mouth daily.    . NON FORMULARY Take 1 tablet by mouth daily. collatrim    . Progesterone Micronized (PROGESTERONE PO) Take 150 mg by mouth 2 (two) times daily.    . Selenium (SELENIMIN PO) Take 100 mcg by mouth daily.    Marland Kitchen testosterone (ANDRODERM) 4 MG/24HR PT24 patch Place 1 patch onto the skin daily. 4 %    .  thyroid (NP THYROID) 15 MG tablet Take 15 mg by mouth daily.    Marland Kitchen UNABLE TO FIND Med Name:estradiol-testosterone-progesterone     No current facility-administered medications on file prior to visit.     Past Surgical History:  Procedure Laterality Date  . APPENDECTOMY  1977  . BLADDER SURGERY     tumor removal  . FINGER SURGERY  2003   dupuytren's contracture  . OVARIAN CYST REMOVAL  1982  . VAGINAL HYSTERECTOMY  1990's    No Known Allergies  Social History   Social History  . Marital status: Divorced    Spouse name: N/A  . Number of children: 2  . Years of education: college   Occupational History  . caregiver    Social History Main Topics  . Smoking status: Never Smoker  . Smokeless tobacco: Never Used  . Alcohol use No  . Drug use: No  . Sexual activity: Not on file   Other Topics Concern  . Not on file   Social History Narrative   Pt lives at home alone.    Caffeine Use- 1 cup in a.m. (1/2 caffeinated)    Family History  Problem Relation Age of  Onset  . Congestive Heart Failure Mother   . Congestive Heart Failure Maternal Grandmother     BP (!) 145/74   Pulse 73   Ht 5\' 6"  (1.676 m)   Wt 138 lb (62.6 kg)   BMI 22.27 kg/m   Review of Systems: See HPI above.     Objective:  Physical Exam:  Gen: NAD, comfortable in exam room  Right ankle/foot: Mod lateral swelling, some bruising of ankle.  No other deformity. Mild limitation ROM all directions. TTP lateral malleolus.  No navicular, base 5th, other tenderness of foot or ankle. Deferred ant drawer and talar tilt.   Pain with syndesmotic compression. Thompsons test negative. NV intact distally.  Left ankle/foot: FROM without pain.   Assessment & Plan:  1. Right ankle injury - independently reviewed radiographs noting distal fibula fracture below level of ankle joint.  Expect this to heal well over about 6-8 weeks.  Cam walker when up and walking around.  Icing, elevation, tylenol and/or  aleve.  F/u in 1 week for reevaluation and repeat x-rays.  Crutches if needed.

## 2017-01-28 NOTE — Assessment & Plan Note (Signed)
independently reviewed radiographs noting distal fibula fracture below level of ankle joint.  Expect this to heal well over about 6-8 weeks.  Cam walker when up and walking around.  Icing, elevation, tylenol and/or aleve.  F/u in 1 week for reevaluation and repeat x-rays.  Crutches if needed.

## 2017-02-02 ENCOUNTER — Ambulatory Visit (HOSPITAL_BASED_OUTPATIENT_CLINIC_OR_DEPARTMENT_OTHER)
Admission: RE | Admit: 2017-02-02 | Discharge: 2017-02-02 | Disposition: A | Discharge status: Home / Self Care | Payer: Medicare Other | Source: Ambulatory Visit | Attending: Family Medicine | Admitting: Family Medicine

## 2017-02-02 ENCOUNTER — Encounter: Payer: Self-pay | Admitting: Family Medicine

## 2017-02-02 ENCOUNTER — Ambulatory Visit (INDEPENDENT_AMBULATORY_CARE_PROVIDER_SITE_OTHER): Payer: Medicare Other | Admitting: Family Medicine

## 2017-02-02 VITALS — BP 127/65 | HR 83 | Ht 66.0 in | Wt 139.0 lb

## 2017-02-02 DIAGNOSIS — S8264XD Nondisplaced fracture of lateral malleolus of right fibula, subsequent encounter for closed fracture with routine healing: Secondary | ICD-10-CM | POA: Diagnosis not present

## 2017-02-02 DIAGNOSIS — S99911D Unspecified injury of right ankle, subsequent encounter: Secondary | ICD-10-CM

## 2017-02-02 DIAGNOSIS — X58XXXD Exposure to other specified factors, subsequent encounter: Secondary | ICD-10-CM | POA: Diagnosis not present

## 2017-02-02 DIAGNOSIS — M25552 Pain in left hip: Secondary | ICD-10-CM | POA: Diagnosis not present

## 2017-02-02 NOTE — Patient Instructions (Signed)
You have a distal fibula fracture below the level of the ankle joint. I'd expect this to heal well over 6 total weeks. Wear the boot when up and walking around at all times. Ok to take this off when icing, washing area, and sleeping (if not too painful). Crutches if needed. Ok to do simple up/down ankle motion exercises twice a day. Ice 15 minutes at a time 3-4 times a day. Elevate above your heart as much as possible. Tylenol and/or aleve if needed for pain. Follow up with me in 2 weeks for reevaluation, repeat x-rays.  You have ischial bursitis of your hip. Do hip side raise exercises, hamstring curls (lying down) 3 sets of 10 once a day. As your ankle fracture improves we'll be able to add more exercises or enroll you in physical therapy for this.

## 2017-02-03 ENCOUNTER — Ambulatory Visit: Payer: Self-pay | Admitting: Family Medicine

## 2017-02-03 DIAGNOSIS — M25552 Pain in left hip: Secondary | ICD-10-CM | POA: Insufficient documentation

## 2017-02-03 NOTE — Progress Notes (Signed)
PCP: Carol Ada, MD  Subjective:   HPI: Patient is a 66 y.o. female here for right ankle injury.  10/10: Patient reports earlier today she was cleaning out her car when she accidentally inverted her right ankle on some acorns. Could bear weight after this but was limping. Pain level up to 7/10 and sharp lateral ankle. + swelling and bruising. Taken ibuprofen and using crutches now. Placed in stirrup splint in ED prior to arrival here. No other skin changes.  No numbness.  10/16: Patient reports she's doing very well. She overdid it a little bit yesterday but improved today. Pain level 2/10 and a soreness lateral right ankle. Wearing boot. Took aleve first couple days. Slight swelling.  Noticed bruising still as well. No other skin changes, numbness. She also reported some longstanding pain left posterior hip, worse with prolonged sitting.  Past Medical History:  Diagnosis Date  . Anxiety and depression   . Cancer [facility])    Bladder  . Hypertension   . Urinary tract infection     Current Outpatient Prescriptions on File Prior to Visit  Medication Sig Dispense Refill  . calcium citrate-vitamin D (CITRACAL+D) 315-200 MG-UNIT per tablet Take 1 tablet by mouth 2 (two) times daily. 630 mg    . Cholecalciferol (VITAMIN D3) 2000 UNITS TABS Take 2 tablets by mouth daily.     . COCONUT OIL PO Take 15 mLs by mouth daily.    . Flax OIL Take 15 mLs by mouth daily.    . IODOQUINOL PO Take 1 tablet by mouth daily.    Marland Kitchen LIOTHYRONINE SODIUM PO Take 5 mg by mouth 2 (two) times daily.    Marland Kitchen LORazepam (ATIVAN) 0.5 MG tablet Take 0.5 mg by mouth at bedtime.    Marland Kitchen losartan (COZAAR) 100 MG tablet Take 100 mg by mouth daily.    Marland Kitchen losartan-hydrochlorothiazide (HYZAAR) 100-25 MG per tablet Take 1 tablet by mouth daily.    . Magnesium 100 MG CAPS Take 1 capsule by mouth daily.    Marland Kitchen MILK THISTLE PO Take 1 capsule by mouth daily.    . Misc Natural Products ([employer]S GLUCOS-CHONDROIT-MSM TS PO) Take 1  tablet by mouth daily.    Marland Kitchen NATURE-THROID 97.5 MG TABS Take 1 tablet by mouth daily.    . NON FORMULARY Take 1 tablet by mouth daily. collatrim    . Progesterone Micronized (PROGESTERONE PO) Take 150 mg by mouth 2 (two) times daily.    . Selenium (SELENIMIN PO) Take 100 mcg by mouth daily.    Marland Kitchen testosterone (ANDRODERM) 4 MG/24HR PT24 patch Place 1 patch onto the skin daily. 4 %    . thyroid (NP THYROID) 15 MG tablet Take 15 mg by mouth daily.    Marland Kitchen UNABLE TO FIND Med Name:estradiol-testosterone-progesterone     No current facility-administered medications on file prior to visit.     Past Surgical History:  Procedure Laterality Date  . APPENDECTOMY  1977  . BLADDER SURGERY     tumor removal  . FINGER SURGERY  2003   dupuytren's contracture  . OVARIAN CYST REMOVAL  1982  . VAGINAL HYSTERECTOMY  1990's    No Known Allergies  Social History   Social History  . Marital status: Divorced    Spouse name: N/A  . Number of children: 2  . Years of education: college   Occupational History  . caregiver    Social History Main Topics  . Smoking status: Never Smoker  . Smokeless tobacco: Never  Used  . Alcohol use No  . Drug use: No  . Sexual activity: Not on file   Other Topics Concern  . Not on file   Social History Narrative   Pt lives at home alone.    Caffeine Use- 1 cup in a.m. (1/2 caffeinated)    Family History  Problem Relation Age of Onset  . Congestive Heart Failure Mother   . Congestive Heart Failure Maternal Grandmother     BP 127/65   Pulse 83   Ht 5\' 6"  (1.676 m)   Wt 139 lb (63 kg)   BMI 22.44 kg/m   Review of Systems: See HPI above.     Objective:  Physical Exam:  Gen: NAD, comfortable in exam room.  Right foot/ankle: Mild swelling, bruising laterally.  No other deformity. Mild limitation ROM all directions. TTP mildly lateral malleolus.  No other tenderness. Deferred ant drawer and talar tilt.   Thompsons test negative. NV intact  distally.  Left ankle/foot: FROM without pain.  Left hip: No gross deformity, swelling, bruising. TTP over ischial tuberosity.  No other tenderness.  No midline TTP. FROM. Strength LEs 5/5 all muscle groups left hip without pain. Negative SLRs. Sensation intact to light touch bilaterally. Negative logroll. Negative fabers and piriformis stretches.  Assessment & Plan:  1. Right ankle injury - independently reviewed repeat radiographs and no displacement of distal fibula fracture.  No early healing though none expected yet.  Continue with cam walker.  Motion exercises.  Icing, elevation, tylenol or aleve.  F/u in 2 weeks for reevaluation, repeat radiographs.  2. Left hip pain - 2/2 ischial bursitis.  Shown home exercises to do daily.  Consider PT in future when ankle heals.

## 2017-02-03 NOTE — Assessment & Plan Note (Signed)
independently reviewed repeat radiographs and no displacement of distal fibula fracture.  No early healing though none expected yet.  Continue with cam walker.  Motion exercises.  Icing, elevation, tylenol or aleve.  F/u in 2 weeks for reevaluation, repeat radiographs.

## 2017-02-03 NOTE — Assessment & Plan Note (Signed)
2/2 ischial bursitis.  Shown home exercises to do daily.  Consider PT in future when ankle heals.

## 2017-02-16 ENCOUNTER — Ambulatory Visit (HOSPITAL_BASED_OUTPATIENT_CLINIC_OR_DEPARTMENT_OTHER)
Admission: RE | Admit: 2017-02-16 | Discharge: 2017-02-16 | Disposition: A | Discharge status: Home / Self Care | Payer: Medicare Other | Source: Ambulatory Visit | Attending: Family Medicine | Admitting: Family Medicine

## 2017-02-16 ENCOUNTER — Ambulatory Visit (INDEPENDENT_AMBULATORY_CARE_PROVIDER_SITE_OTHER): Payer: Medicare Other | Admitting: Family Medicine

## 2017-02-16 ENCOUNTER — Encounter: Payer: Self-pay | Admitting: Family Medicine

## 2017-02-16 VITALS — BP 120/80 | HR 79 | Ht 66.0 in | Wt 139.0 lb

## 2017-02-16 DIAGNOSIS — X58XXXD Exposure to other specified factors, subsequent encounter: Secondary | ICD-10-CM | POA: Insufficient documentation

## 2017-02-16 DIAGNOSIS — S99911D Unspecified injury of right ankle, subsequent encounter: Secondary | ICD-10-CM

## 2017-02-16 NOTE — Assessment & Plan Note (Signed)
independently reviewed radiographs and mild interval healing but no callus noted of distal fibula fracture.  Continue boot or splint for 3 more weeks when ambulating.  Tylenol, ibuprofen if needed.  F/u in 3 weeks when she is 6 weeks out.

## 2017-02-16 NOTE — Progress Notes (Signed)
PCP: Carol Ada, MD  Subjective:   HPI: Patient is a 66 y.o. female here for right ankle injury.  10/10: Patient reports earlier today she was cleaning out her car when she accidentally inverted her right ankle on some acorns. Could bear weight after this but was limping. Pain level up to 7/10 and sharp lateral ankle. + swelling and bruising. Taken ibuprofen and using crutches now. Placed in stirrup splint in ED prior to arrival here. No other skin changes.  No numbness.  10/16: Patient reports she's doing very well. She overdid it a little bit yesterday but improved today. Pain level 2/10 and a soreness lateral right ankle. Wearing boot. Took aleve first couple days. Slight swelling.  Noticed bruising still as well. No other skin changes, numbness. She also reported some longstanding pain left posterior hip, worse with prolonged sitting.  10/30: Patient reports she is doing well. Pain level currently 0/10. She is wearing cam walker or stirrup splint in a regular boot. Some swelling. No skin changes, numbness.  Past Medical History:  Diagnosis Date  . Anxiety and depression   . Cancer [facility])    Bladder  . Hypertension   . Urinary tract infection     Current Outpatient Prescriptions on File Prior to Visit  Medication Sig Dispense Refill  . calcium citrate-vitamin D (CITRACAL+D) 315-200 MG-UNIT per tablet Take 1 tablet by mouth 2 (two) times daily. 630 mg    . Cholecalciferol (VITAMIN D3) 2000 UNITS TABS Take 2 tablets by mouth daily.     . COCONUT OIL PO Take 15 mLs by mouth daily.    . Flax OIL Take 15 mLs by mouth daily.    . IODOQUINOL PO Take 1 tablet by mouth daily.    Marland Kitchen LIOTHYRONINE SODIUM PO Take 5 mg by mouth 2 (two) times daily.    Marland Kitchen LORazepam (ATIVAN) 0.5 MG tablet Take 0.5 mg by mouth at bedtime.    Marland Kitchen losartan (COZAAR) 100 MG tablet Take 100 mg by mouth daily.    Marland Kitchen losartan-hydrochlorothiazide (HYZAAR) 100-25 MG per tablet Take 1 tablet by mouth daily.     . Magnesium 100 MG CAPS Take 1 capsule by mouth daily.    Marland Kitchen MILK THISTLE PO Take 1 capsule by mouth daily.    . Misc Natural Products ([employer]S GLUCOS-CHONDROIT-MSM TS PO) Take 1 tablet by mouth daily.    Marland Kitchen NATURE-THROID 97.5 MG TABS Take 1 tablet by mouth daily.    . NON FORMULARY Take 1 tablet by mouth daily. collatrim    . Progesterone Micronized (PROGESTERONE PO) Take 150 mg by mouth 2 (two) times daily.    . Selenium (SELENIMIN PO) Take 100 mcg by mouth daily.    Marland Kitchen testosterone (ANDRODERM) 4 MG/24HR PT24 patch Place 1 patch onto the skin daily. 4 %    . thyroid (NP THYROID) 15 MG tablet Take 15 mg by mouth daily.    Marland Kitchen UNABLE TO FIND Med Name:estradiol-testosterone-progesterone     No current facility-administered medications on file prior to visit.     Past Surgical History:  Procedure Laterality Date  . APPENDECTOMY  1977  . BLADDER SURGERY     tumor removal  . FINGER SURGERY  2003   dupuytren's contracture  . OVARIAN CYST REMOVAL  1982  . VAGINAL HYSTERECTOMY  1990's    No Known Allergies  Social History   Social History  . Marital status: Divorced    Spouse name: N/A  . Number of children: 2  .  Years of education: college   Occupational History  . caregiver    Social History Main Topics  . Smoking status: Never Smoker  . Smokeless tobacco: Never Used  . Alcohol use No  . Drug use: No  . Sexual activity: Not on file   Other Topics Concern  . Not on file   Social History Narrative   Pt lives at home alone.    Caffeine Use- 1 cup in a.m. (1/2 caffeinated)    Family History  Problem Relation Age of Onset  . Congestive Heart Failure Mother   . Congestive Heart Failure Maternal Grandmother     BP 120/80   Pulse 79   Ht 5\' 6"  (1.676 m)   Wt 139 lb (63 kg)   BMI 22.44 kg/m   Review of Systems: See HPI above.     Objective:  Physical Exam:  Gen: NAD, comfortable in exam room.  Right foot/ankle: No gross deformity, swelling, ecchymoses FROM  ankle with 5/5 strength. TTP mildly lateral malleolus.  No other tenderness. Negative ant drawer and talar tilt.   Negative syndesmotic compression. Thompsons test negative. NV intact distally.  Left ankle/foot: FROM without pain.  Assessment & Plan:  1. Right ankle injury - independently reviewed radiographs and mild interval healing but no callus noted of distal fibula fracture.  Continue boot or splint for 3 more weeks when ambulating.  Tylenol, ibuprofen if needed.  F/u in 3 weeks when she is 6 weeks out.

## 2017-02-16 NOTE — Patient Instructions (Signed)
You're doing great! Continue with the boot or splint for 3 more weeks when up and walking around. Tylenol, ibuprofen only if needed. Follow up with me in 3 weeks - I'll examine you first then we'll decide whether or not to do x-rays.

## 2017-03-09 ENCOUNTER — Ambulatory Visit: Payer: Self-pay | Admitting: Family Medicine

## 2017-03-16 ENCOUNTER — Encounter: Payer: Self-pay | Admitting: Family Medicine

## 2017-03-16 ENCOUNTER — Ambulatory Visit: Payer: Medicare Other | Admitting: Family Medicine

## 2017-03-16 VITALS — BP 130/67 | HR 64 | Ht 66.0 in | Wt 140.0 lb

## 2017-03-16 DIAGNOSIS — M7070 Other bursitis of hip, unspecified hip: Secondary | ICD-10-CM | POA: Diagnosis not present

## 2017-03-16 DIAGNOSIS — S99911D Unspecified injury of right ankle, subsequent encounter: Secondary | ICD-10-CM | POA: Diagnosis not present

## 2017-03-16 NOTE — Patient Instructions (Signed)
You're doing great! Start theraband strengthening exercises 3 sets of 10 once a day for 4-6 weeks. Icing, tylenol only if needed. No restrictions on activities at this point.  For the ischial bursitis do the hamstring strengthening exercises (curls, swings, lunges) and work your way up to 3 sets of 10 once a day. Let me know if you want to do physical therapy for this. Follow up with me as needed otherwise.

## 2017-03-17 ENCOUNTER — Encounter: Payer: Self-pay | Admitting: Family Medicine

## 2017-03-17 NOTE — Assessment & Plan Note (Signed)
Prior radiographs with interval healing.  Clinically healed now more than 6 weeks out from distal fibula fracture.  Start strengthening exercises.  Icing, tylenol only if needed.  No restrictions.  F/u prn.

## 2017-03-17 NOTE — Progress Notes (Signed)
PCP: Carol Ada, MD  Subjective:   HPI: Patient is a 66 y.o. female here for right ankle injury.  10/10: Patient reports earlier today she was cleaning out her car when she accidentally inverted her right ankle on some acorns. Could bear weight after this but was limping. Pain level up to 7/10 and sharp lateral ankle. + swelling and bruising. Taken ibuprofen and using crutches now. Placed in stirrup splint in ED prior to arrival here. No other skin changes.  No numbness.  10/16: Patient reports she's doing very well. She overdid it a little bit yesterday but improved today. Pain level 2/10 and a soreness lateral right ankle. Wearing boot. Took aleve first couple days. Slight swelling.  Noticed bruising still as well. No other skin changes, numbness. She also reported some longstanding pain left posterior hip, worse with prolonged sitting.  10/30: Patient reports she is doing well. Pain level currently 0/10. She is wearing cam walker or stirrup splint in a regular boot. Some swelling. No skin changes, numbness.  11/27: Patient reports she's doing very well. Pain level 0/10. Able to move ankle in all directions. Uses stirrup splint if on irregular ground, around acorns. No swelling, skin changes.  Past Medical History:  Diagnosis Date  . Anxiety and depression   . Cancer [facility])    Bladder  . Hypertension   . Urinary tract infection     Current Outpatient Medications on File Prior to Visit  Medication Sig Dispense Refill  . calcium citrate-vitamin D (CITRACAL+D) 315-200 MG-UNIT per tablet Take 1 tablet by mouth 2 (two) times daily. 630 mg    . Cholecalciferol (VITAMIN D3) 2000 UNITS TABS Take 2 tablets by mouth daily.     . COCONUT OIL PO Take 15 mLs by mouth daily.    . Flax OIL Take 15 mLs by mouth daily.    . IODOQUINOL PO Take 1 tablet by mouth daily.    Marland Kitchen LIOTHYRONINE SODIUM PO Take 5 mg by mouth 2 (two) times daily.    Marland Kitchen LORazepam (ATIVAN) 0.5 MG tablet  Take 0.5 mg by mouth at bedtime.    Marland Kitchen losartan (COZAAR) 100 MG tablet Take 100 mg by mouth daily.    Marland Kitchen losartan-hydrochlorothiazide (HYZAAR) 100-25 MG per tablet Take 1 tablet by mouth daily.    . Magnesium 100 MG CAPS Take 1 capsule by mouth daily.    Marland Kitchen MILK THISTLE PO Take 1 capsule by mouth daily.    . Misc Natural Products ([employer]S GLUCOS-CHONDROIT-MSM TS PO) Take 1 tablet by mouth daily.    Marland Kitchen NATURE-THROID 97.5 MG TABS Take 1 tablet by mouth daily.    . NON FORMULARY Take 1 tablet by mouth daily. collatrim    . Progesterone Micronized (PROGESTERONE PO) Take 150 mg by mouth 2 (two) times daily.    . Selenium (SELENIMIN PO) Take 100 mcg by mouth daily.    Marland Kitchen testosterone (ANDRODERM) 4 MG/24HR PT24 patch Place 1 patch onto the skin daily. 4 %    . thyroid (NP THYROID) 15 MG tablet Take 15 mg by mouth daily.    Marland Kitchen UNABLE TO FIND Med Name:estradiol-testosterone-progesterone     No current facility-administered medications on file prior to visit.     Past Surgical History:  Procedure Laterality Date  . APPENDECTOMY  1977  . BLADDER SURGERY     tumor removal  . FINGER SURGERY  2003   dupuytren's contracture  . OVARIAN CYST REMOVAL  1982  . VAGINAL HYSTERECTOMY  1990's  No Known Allergies  Social History   Socioeconomic History  . Marital status: Divorced    Spouse name: Not on file  . Number of children: 2  . Years of education: college  . Highest education level: Not on file  Social Needs  . Financial resource strain: Not on file  . Food insecurity - worry: Not on file  . Food insecurity - inability: Not on file  . Transportation needs - medical: Not on file  . Transportation needs - non-medical: Not on file  Occupational History  . Occupation: caregiver  Tobacco Use  . Smoking status: Never Smoker  . Smokeless tobacco: Never Used  Substance and Sexual Activity  . Alcohol use: No  . Drug use: No  . Sexual activity: Not on file  Other Topics Concern  . Not on file   Social History Narrative   Pt lives at home alone.    Caffeine Use- 1 cup in a.m. (1/2 caffeinated)    Family History  Problem Relation Age of Onset  . Congestive Heart Failure Mother   . Congestive Heart Failure Maternal Grandmother     BP 130/67   Pulse 64   Ht 5\' 6"  (1.676 m)   Wt 140 lb (63.5 kg)   BMI 22.60 kg/m   Review of Systems: See HPI above.     Objective:  Physical Exam:  Gen: NAD, comfortable in exam room.  Right foot/ankle: No gross deformity, swelling, ecchymoses FROM with 5/5 strength all directions. No TTP Negative ant drawer and talar tilt.   Negative syndesmotic compression. Thompsons test negative. NV intact distally.  Assessment & Plan:  1. Right ankle injury - Prior radiographs with interval healing.  Clinically healed now more than 6 weeks out from distal fibula fracture.  Start strengthening exercises.  Icing, tylenol only if needed.  No restrictions.  F/u prn.  We also talked about her ischial bursitis - shown home exercises to do and she will consider physical therapy as well.

## 2017-04-22 ENCOUNTER — Telehealth: Payer: Self-pay | Admitting: Family Medicine

## 2017-04-22 NOTE — Telephone Encounter (Signed)
Please refer her to the [facility] PT, [place] [employer] location.  Thanks!

## 2017-04-22 NOTE — Addendum Note (Signed)
Addended by: Sherrie George F on: 04/22/2017 04:43 PM   Modules accepted: Orders

## 2017-04-22 NOTE — Telephone Encounter (Signed)
Patient would like to go ahead with physical therapy for the ischial bursitis  Patient lives in [place] and would prefer an office near her

## 2017-04-22 NOTE — Telephone Encounter (Signed)
Order sent to [facility].

## 2017-05-05 ENCOUNTER — Ambulatory Visit: Payer: Medicare Other | Attending: Family Medicine | Admitting: Physical Therapy

## 2017-05-05 ENCOUNTER — Other Ambulatory Visit: Payer: Self-pay

## 2017-05-05 ENCOUNTER — Encounter: Payer: Self-pay | Admitting: Physical Therapy

## 2017-05-05 DIAGNOSIS — M25552 Pain in left hip: Secondary | ICD-10-CM | POA: Diagnosis not present

## 2017-05-05 NOTE — Therapy (Signed)
[place], [place], [address] Phone: [phone]   Fax:  [phone]  Physical Therapy Evaluation  Patient Details  Name: Sheri Mueller MRN: [id number] Date of Birth: [birth year] Referring Provider: Barbaraann Barthel   Encounter Date: 05/05/2017  PT End of Session - 05/05/17 1437    Visit Number  1    Date for PT Re-Evaluation  07/03/17    PT Start Time  1400    PT Stop Time  1445    PT Time Calculation (min)  45 min    Activity Tolerance  Patient tolerated treatment well    Behavior During Therapy  [facility] for tasks assessed/performed       Past Medical History:  Diagnosis Date  . Anxiety and depression   . Cancer [facility])    Bladder  . Hypertension   . Urinary tract infection     Past Surgical History:  Procedure Laterality Date  . APPENDECTOMY  1977  . BLADDER SURGERY     tumor removal  . FINGER SURGERY  2003   dupuytren's contracture  . OVARIAN CYST REMOVAL  1982  . VAGINAL HYSTERECTOMY  1990's    There were no vitals filed for this visit.   Subjective Assessment - 05/05/17 1352    Subjective  Patient reports that she was doing a straight leg dead lift about a year ago, she reports that her pain has gottten a little better over time, she reports that she had an ankle fracture in the fall and that her resting more helped with the pain.  She reports that she currently is unable to do squats or sit on harder surfaces    Limitations  Sitting;House hold activities    Patient Stated Goals  Less pain, squat without difficulty    Currently in Pain?  Yes    Pain Score  0-No pain    Pain Location  Hip    Pain Orientation  Left;Posterior    Pain Descriptors / Indicators  Sore    Pain Type  Acute pain    Pain Onset  More than a month ago    Pain Frequency  Intermittent    Aggravating Factors   sitting, squatting, pain up to 4/10, reports that she has not attempted squatting due to fear    Pain  Relieving Factors  lying down helps pain can be 0/10    Effect of Pain on Daily Activities  limits sititng and does not do her normal exercise routine         [facility] PT Assessment - 05/05/17 0001      Assessment   Medical Diagnosis  left ischial bursitis    Referring Provider  [place]    Onset Date/Surgical Date  05/05/16    Prior Therapy  no      Precautions   Precautions  None      Balance Screen   Has the patient fallen in the past 6 months  Yes    How many times?  1    Has the patient had a decrease in activity level because of a fear of falling?   No    Is the patient reluctant to leave their home because of a fear of falling?   No      Home Environment   Additional Comments  housework, [place] [employer], has stairs      Prior Function   Level of Independence  Independent    Leisure  cares for  58 year old dad, cooks a lot, would like to return to squatting routine, lunges      ROM / Strength   AROM / PROM / Strength  AROM;PROM;Strength      AROM   Overall AROM Comments  Lumbar ROM is WNL's, LE ROM is WFL's but she is very tight in the mms      Strength   Overall Strength Comments  4+/5, no pain      Flexibility   Soft Tissue Assessment /Muscle Length  yes    Hamstrings  very tight    Quadriceps  very tight    ITB  tight    Piriformis  very tight and tender      Palpation   Palpation comment  very tender in the left ischial area and along the piriformis and ITB area             Objective measurements completed on examination: See above findings.      [facility] Adult PT Treatment/Exercise - 05/05/17 0001      Modalities   Modalities  Iontophoresis      Iontophoresis   Type of Iontophoresis  Dexamethasone    Location  left ischial tuberosisty    Dose  73mA dose    Time  4 hour patch             PT Education - 05/05/17 1437    Education provided  Yes    Education Details  Gave handout about HS, piriformis and ITB stretches    Person(s) Educated   Patient    Methods  Explanation;Demonstration;Handout    Comprehension  Verbalized understanding       PT Short Term Goals - 05/05/17 1441      PT SHORT TERM GOAL #1   Title  independent with initial HEP    Time  2    Period  Weeks    Status  New        PT Long Term Goals - 05/05/17 1441      PT LONG TERM GOAL #1   Title  decrease pain 50%    Time  8    Period  Weeks    Status  New      PT LONG TERM GOAL #2   Title  sit without discomfort    Time  8    Period  Weeks    Status  New      PT LONG TERM GOAL #3   Title  report overall pain decreased 50%    Time  8    Period  Weeks    Status  New      PT LONG TERM GOAL #4   Title  resume her home gym routine safely    Time  8    Period  Weeks    Status  New             Plan - 05/05/17 1438    Clinical Impression Statement  Patient started having left ischial pain about a year ago after doing straight leg dead lifts, she reports that the pain has continued until she had an ankle fracture and she was not as active, reports a little better pain now but still left ischial pain and tenderness, she is tight in the LE's    Clinical Presentation  Stable    Clinical Decision Making  Low    Rehab Potential  Good    PT Frequency  1x / week  PT Duration  8 weeks    PT Treatment/Interventions  ADLs/Self Care Home Management;Cryotherapy;Electrical Stimulation;Iontophoresis 4mg /ml Dexamethasone;Neuromuscular re-education;Therapeutic exercise;Therapeutic activities;Patient/family education;Manual techniques       Patient will benefit from skilled therapeutic intervention in order to improve the following deficits and impairments:  Decreased activity tolerance, Decreased strength, Impaired flexibility, Improper body mechanics, Pain, Increased muscle spasms, Decreased range of motion  Visit Diagnosis: Pain in left hip - Plan: PT plan of care cert/re-cert     Problem List Patient Active Problem List   Diagnosis Date  Noted  . Left hip pain 02/03/2017  . Right ankle injury, subsequent encounter 01/28/2017    Sumner Boast., PT 05/05/2017, 2:45 PM  [place], [place], [address] Phone: [phone]   Fax:  [phone]  Name: Carmencita Cusic MRN: [id number] Date of Birth: 05-23-1950

## 2017-05-19 ENCOUNTER — Ambulatory Visit: Payer: Medicare Other | Admitting: Physical Therapy

## 2017-05-19 DIAGNOSIS — M25552 Pain in left hip: Secondary | ICD-10-CM

## 2017-05-19 NOTE — Therapy (Signed)
[place], [place], [address] Phone: [phone]   Fax:  [phone]  Physical Therapy Treatment  Patient Details  Name: Sheri Mueller MRN: [id number] Date of Birth: 1950/06/10 Referring Provider: Barbaraann Barthel   Encounter Date: 05/19/2017  PT End of Session - 05/19/17 1314    Visit Number  2    Date for PT Re-Evaluation  07/03/17    PT Start Time  1309    PT Stop Time  1356    PT Time Calculation (min)  47 min    Activity Tolerance  Patient tolerated treatment well    Behavior During Therapy  [facility] for tasks assessed/performed       Past Medical History:  Diagnosis Date  . Anxiety and depression   . Cancer [facility])    Bladder  . Hypertension   . Urinary tract infection     Past Surgical History:  Procedure Laterality Date  . APPENDECTOMY  1977  . BLADDER SURGERY     tumor removal  . FINGER SURGERY  2003   dupuytren's contracture  . OVARIAN CYST REMOVAL  1982  . VAGINAL HYSTERECTOMY  1990's    There were no vitals filed for this visit.  Subjective Assessment - 05/19/17 1314    Subjective  Sheri Mueller doing well today reporting she's been performing stretches with good relief and little pain over this past two weeks.      Patient Stated Goals  Less pain, squat without difficulty    Currently in Pain?  No/denies    Pain Score  0-No pain    Multiple Pain Sites  No                      [facility] Adult PT Treatment/Exercise - 05/19/17 1322      Self-Care   Self-Care  Other Self-Care Comments    Other Self-Care Comments   L buttocks release with ball on wall       Exercises   Exercises  Knee/Hip      Knee/Hip Exercises: Stretches   Passive Hamstring Stretch  Left;2 reps;30 seconds    Quad Stretch  Left;30 seconds    Quad Stretch Limitations  prone with strap     Hip Flexor Stretch  Left;2 reps;30 seconds    Hip Flexor Stretch Limitations  with strap in mod thomas     ITB Stretch   Left;2 reps;30 seconds    ITB Stretch Limitations  with strap     Piriformis Stretch  2 reps;Left;30 seconds      Knee/Hip Exercises: Aerobic   Nustep  NuStep: lvl 4, 6 min       Knee/Hip Exercises: Machines for Strengthening   Cybex Knee Flexion  B LE's 15# x 10 reps       Knee/Hip Exercises: Standing   Wall Squat  10 reps;3 seconds    Wall Squat Limitations  leaning on orange p-ball on wall       Knee/Hip Exercises: Supine   Bridges  Both;10 reps      Knee/Hip Exercises: Sidelying   Hip ABduction  Left;15 reps    Clams  L clam shell with red TB x 10 reps              PT Education - 05/19/17 1403    Education provided  Yes    Education Details  bridge with abduction red looped TB at knees and issued to pt., red TB  hamstring curl     Person(s) Educated  Patient    Methods  Explanation;Demonstration;Verbal cues;Handout    Comprehension  Verbalized understanding;Returned demonstration;Verbal cues required;Need further instruction       PT Short Term Goals - 05/19/17 1315      PT SHORT TERM GOAL #1   Title  independent with initial HEP    Time  2    Period  Weeks    Status  On-going        PT Long Term Goals - 05/05/17 1441      PT LONG TERM GOAL #1   Title  decrease pain 50%    Time  8    Period  Weeks    Status  New      PT LONG TERM GOAL #2   Title  sit without discomfort    Time  8    Period  Weeks    Status  New      PT LONG TERM GOAL #3   Title  report overall pain decreased 50%    Time  8    Period  Weeks    Status  New      PT LONG TERM GOAL #4   Title  resume her home gym routine safely    Time  [place] - 05/19/17 1316    Clinical Impression Statement  Sheri Mueller doing well today.  Has been performing LE stretches without issue.  Tolerated all LE stretching and strengthening activities well today.  Deferred ionto patch today as pt. unsure of benefit.  Pt. reported at end of visit she is  unsure of if she plans to return to therapy.  Will continue to progress toward goals per pt. in coming visits.     PT Treatment/Interventions  ADLs/Self Care Home Management;Cryotherapy;Electrical Stimulation;Iontophoresis 4mg /ml Dexamethasone;Neuromuscular re-education;Therapeutic exercise;Therapeutic activities;Patient/family education;Manual techniques    Consulted and Agree with Plan of Care  Patient       Patient will benefit from skilled therapeutic intervention in order to improve the following deficits and impairments:  Decreased activity tolerance, Decreased strength, Impaired flexibility, Improper body mechanics, Pain, Increased muscle spasms, Decreased range of motion  Visit Diagnosis: Pain in left hip     Problem List Patient Active Problem List   Diagnosis Date Noted  . Left hip pain 02/03/2017  . Right ankle injury, subsequent encounter 01/28/2017    Bess Harvest, PTA 05/19/17 3:52 PM  [place] [address] [place], [place], [address] Phone: [phone]   Fax:  [phone]  Name: Sheri Mueller MRN: [id number] Date of Birth: August 07, 1950

## 2017-06-02 ENCOUNTER — Encounter: Payer: Self-pay | Admitting: Physical Therapy

## 2017-06-02 ENCOUNTER — Ambulatory Visit: Payer: Medicare Other | Attending: Family Medicine | Admitting: Physical Therapy

## 2017-06-02 ENCOUNTER — Ambulatory Visit: Payer: Medicare Other | Admitting: Physical Therapy

## 2017-06-02 DIAGNOSIS — M25552 Pain in left hip: Secondary | ICD-10-CM | POA: Diagnosis not present

## 2017-06-02 NOTE — Therapy (Signed)
[place], [place], [address] Phone: [phone]   Fax:  [phone]  Physical Therapy Treatment  Patient Details  Name: Sheri Mueller MRN: [id number] Date of Birth: 12/22/1950 Referring Provider: Barbaraann Barthel   Encounter Date: 06/02/2017  PT End of Session - 06/02/17 1519    Visit Number  3    PT Start Time  2297    PT Stop Time  1523    PT Time Calculation (min)  46 min    Activity Tolerance  Patient tolerated treatment well    Behavior During Therapy  [facility] for tasks assessed/performed       Past Medical History:  Diagnosis Date  . Anxiety and depression   . Cancer [facility])    Bladder  . Hypertension   . Urinary tract infection     Past Surgical History:  Procedure Laterality Date  . APPENDECTOMY  1977  . BLADDER SURGERY     tumor removal  . FINGER SURGERY  2003   dupuytren's contracture  . OVARIAN CYST REMOVAL  1982  . VAGINAL HYSTERECTOMY  1990's    There were no vitals filed for this visit.  Subjective Assessment - 06/02/17 1514    Subjective  Patient reports overall doing very well, reports she has not had much pain in the HS, reports some tension in the glutela mms, patient reports that she got the results of her bone scan and that it showed osteopenia, she wants to know exercises that she can do to help    Currently in Pain?  No/denies                      [facility] Adult PT Treatment/Exercise - 06/02/17 0001      Self-Care   Self-Care  Other Self-Care Comments    Other Self-Care Comments   gave her HEP with tband green and red for scapular and upper back exercises, and for 4 way hip, problems solved with her on planks, ball bridges and multi joint exercises of squat curl and press, went over precautions with lunges and straight leg dead lifts that may cause HS issues      Knee/Hip Exercises: Stretches   Other Knee/Hip Stretches  reviewed HEP, she is independent and  feels like they help alot             PT Education - 06/02/17 1518    Education provided  Yes    Education Details  red and green tband, UE and LE's  Went over other HEP with her and had herdomostrate and I corrected form    Person(s) Educated  Patient    Methods  Explanation;Demonstration;Tactile cues;Verbal cues;Handout    Comprehension  Returned demonstration;Verbalized understanding;Verbal cues required       PT Short Term Goals - 06/02/17 1525      PT SHORT TERM GOAL #1   Title  independent with initial HEP    Status  Achieved        PT Long Term Goals - 06/02/17 1525      PT LONG TERM GOAL #1   Title  decrease pain 50%    Status  Achieved      PT LONG TERM GOAL #2   Title  sit without discomfort    Status  Achieved      PT LONG TERM GOAL #3   Title  report overall pain decreased 50%    Status  Achieved  PT LONG TERM GOAL #4   Title  resume her home gym routine safely    Status  Achieved            Plan - 06/02/17 1523    Clinical Impression Statement  Patient reports that seh feels like she is doing well enough to not comeback, she had many more questions today with the diagnosis of osteoporosis, I went over the benefit of weight bearing and resistance training as well as the need to continue.  She was able to return demo the exercises.    PT Next Visit Plan  will d/c with goals met    Consulted and Agree with Plan of Care  Patient       Patient will benefit from skilled therapeutic intervention in order to improve the following deficits and impairments:  Decreased activity tolerance, Decreased strength, Impaired flexibility, Improper body mechanics, Pain, Increased muscle spasms, Decreased range of motion  Visit Diagnosis: Pain in left hip     Problem List Patient Active Problem List   Diagnosis Date Noted  . Left hip pain 02/03/2017  . Right ankle injury, subsequent encounter 01/28/2017    Sumner Boast., PT 06/02/2017, 3:25  PM  [place], [place], [address] Phone: [phone]   Fax:  [phone]  Name: Sheri Mueller MRN: [id number] Date of Birth: 12-Jun-1950

## 2017-09-30 ENCOUNTER — Telehealth: Payer: Self-pay | Admitting: Family Medicine

## 2017-09-30 NOTE — Telephone Encounter (Signed)
She had ischial bursitis diagnosed clinically last year.  She did not have x-rays of her hip - she had x-rays of her ankle.

## 2017-09-30 NOTE — Telephone Encounter (Signed)
Patient would like to know if her previous x-ray (if there was one?) of her hip showed any arthritis in the hip joints?  (she was not real sure about what was actually done during her visit)

## 2017-10-25 ENCOUNTER — Other Ambulatory Visit: Payer: Self-pay | Admitting: Urology

## 2017-11-01 ENCOUNTER — Other Ambulatory Visit: Payer: Self-pay

## 2017-11-01 ENCOUNTER — Encounter (HOSPITAL_BASED_OUTPATIENT_CLINIC_OR_DEPARTMENT_OTHER): Payer: Self-pay

## 2017-11-01 NOTE — Progress Notes (Signed)
Spoke with:  Vaughan Basta NPO: No food after midnight/Clear liquids until 6:30AM DOS Arrival time:  1130AM Labs:  EKG, Istat4 AM medications: Losartan, Thyroid, Estradiol, Fluoxetine Pre op orders:  Yes Ride home:  Elissa Hefty ([place]) [phone]

## 2017-11-03 ENCOUNTER — Encounter (HOSPITAL_BASED_OUTPATIENT_CLINIC_OR_DEPARTMENT_OTHER)
Admission: RE | Disposition: A | Discharge status: Home / Self Care | Payer: Self-pay | Source: Ambulatory Visit | Attending: Urology

## 2017-11-03 ENCOUNTER — Ambulatory Visit (HOSPITAL_BASED_OUTPATIENT_CLINIC_OR_DEPARTMENT_OTHER): Payer: Medicare Other | Admitting: Anesthesiology

## 2017-11-03 ENCOUNTER — Encounter (HOSPITAL_BASED_OUTPATIENT_CLINIC_OR_DEPARTMENT_OTHER): Payer: Self-pay | Admitting: *Deleted

## 2017-11-03 ENCOUNTER — Other Ambulatory Visit: Payer: Self-pay

## 2017-11-03 ENCOUNTER — Ambulatory Visit (HOSPITAL_BASED_OUTPATIENT_CLINIC_OR_DEPARTMENT_OTHER)
Admission: RE | Admit: 2017-11-03 | Discharge: 2017-11-03 | Disposition: A | Discharge status: Home / Self Care | Payer: Medicare Other | Source: Ambulatory Visit | Attending: Urology | Admitting: Urology

## 2017-11-03 DIAGNOSIS — F419 Anxiety disorder, unspecified: Secondary | ICD-10-CM | POA: Diagnosis not present

## 2017-11-03 DIAGNOSIS — Z8249 Family history of ischemic heart disease and other diseases of the circulatory system: Secondary | ICD-10-CM | POA: Insufficient documentation

## 2017-11-03 DIAGNOSIS — Z79890 Hormone replacement therapy (postmenopausal): Secondary | ICD-10-CM | POA: Diagnosis not present

## 2017-11-03 DIAGNOSIS — E039 Hypothyroidism, unspecified: Secondary | ICD-10-CM | POA: Insufficient documentation

## 2017-11-03 DIAGNOSIS — C679 Malignant neoplasm of bladder, unspecified: Secondary | ICD-10-CM | POA: Diagnosis present

## 2017-11-03 DIAGNOSIS — Z90710 Acquired absence of both cervix and uterus: Secondary | ICD-10-CM | POA: Diagnosis not present

## 2017-11-03 DIAGNOSIS — D414 Neoplasm of uncertain behavior of bladder: Secondary | ICD-10-CM | POA: Insufficient documentation

## 2017-11-03 DIAGNOSIS — Z8551 Personal history of malignant neoplasm of bladder: Secondary | ICD-10-CM | POA: Insufficient documentation

## 2017-11-03 DIAGNOSIS — Z87440 Personal history of urinary (tract) infections: Secondary | ICD-10-CM | POA: Insufficient documentation

## 2017-11-03 DIAGNOSIS — Z87442 Personal history of urinary calculi: Secondary | ICD-10-CM | POA: Diagnosis not present

## 2017-11-03 DIAGNOSIS — Z79899 Other long term (current) drug therapy: Secondary | ICD-10-CM | POA: Insufficient documentation

## 2017-11-03 DIAGNOSIS — I10 Essential (primary) hypertension: Secondary | ICD-10-CM | POA: Insufficient documentation

## 2017-11-03 DIAGNOSIS — D494 Neoplasm of unspecified behavior of bladder: Secondary | ICD-10-CM

## 2017-11-03 HISTORY — DX: Malignant neoplasm of bladder, unspecified: C67.9

## 2017-11-03 HISTORY — DX: Unspecified osteoarthritis, unspecified site: M19.90

## 2017-11-03 HISTORY — DX: Hypothyroidism, unspecified: E03.9

## 2017-11-03 HISTORY — DX: Other fracture of right lower leg, initial encounter for closed fracture: S82.891A

## 2017-11-03 HISTORY — DX: Other symptoms and signs involving the nervous system: R29.818

## 2017-11-03 HISTORY — DX: Other bursitis of hip, unspecified hip: M70.70

## 2017-11-03 HISTORY — PX: CYSTOSCOPY W/ RETROGRADES: SHX1426

## 2017-11-03 HISTORY — DX: Unspecified cataract: H26.9

## 2017-11-03 HISTORY — DX: Pain in right hip: M25.551

## 2017-11-03 HISTORY — DX: Insomnia, unspecified: G47.00

## 2017-11-03 HISTORY — DX: Other specified abnormal findings of blood chemistry: R79.89

## 2017-11-03 HISTORY — DX: Other bursitis of elbow, left elbow: M70.32

## 2017-11-03 HISTORY — DX: Other specified postprocedural states: Z98.890

## 2017-11-03 HISTORY — DX: Personal history of urinary calculi: Z87.442

## 2017-11-03 HISTORY — PX: TRANSURETHRAL RESECTION OF BLADDER TUMOR WITH MITOMYCIN-C: SHX6459

## 2017-11-03 HISTORY — DX: Pain in left hip: M25.552

## 2017-11-03 HISTORY — DX: Other specified postprocedural states: R11.2

## 2017-11-03 LAB — POCT I-STAT 4, (NA,K, GLUC, HGB,HCT)
Glucose, Bld: 96 mg/dL (ref 70–99)
HEMATOCRIT: 38 % (ref 36.0–46.0)
Hemoglobin: 12.9 g/dL (ref 12.0–15.0)
Potassium: 3.5 mmol/L (ref 3.5–5.1)
SODIUM: 143 mmol/L (ref 135–145)

## 2017-11-03 SURGERY — TRANSURETHRAL RESECTION OF BLADDER TUMOR WITH MITOMYCIN-C
Anesthesia: General

## 2017-11-03 MED ORDER — PROPOFOL 10 MG/ML IV BOLUS
INTRAVENOUS | Status: AC
  Filled 2017-11-03: qty 20

## 2017-11-03 MED ORDER — KETOROLAC TROMETHAMINE 30 MG/ML IJ SOLN
INTRAMUSCULAR | Status: AC
  Filled 2017-11-03: qty 1

## 2017-11-03 MED ORDER — FENTANYL CITRATE (PF) 100 MCG/2ML IJ SOLN
INTRAMUSCULAR | Status: AC
  Filled 2017-11-03: qty 2

## 2017-11-03 MED ORDER — LIDOCAINE 2% (20 MG/ML) 5 ML SYRINGE
INTRAMUSCULAR | Status: AC
  Filled 2017-11-03: qty 5

## 2017-11-03 MED ORDER — CEFAZOLIN SODIUM-DEXTROSE 2-4 GM/100ML-% IV SOLN
INTRAVENOUS | Status: AC
  Filled 2017-11-03: qty 100

## 2017-11-03 MED ORDER — ONDANSETRON HCL 4 MG/2ML IJ SOLN
INTRAMUSCULAR | Status: DC | PRN
  Administered 2017-11-03: 4 mg via INTRAVENOUS

## 2017-11-03 MED ORDER — PHENAZOPYRIDINE HCL 200 MG PO TABS
200.0000 mg | ORAL_TABLET | Freq: Three times a day (TID) | ORAL | Status: DC
  Administered 2017-11-03: 200 mg via ORAL
  Filled 2017-11-03: qty 1

## 2017-11-03 MED ORDER — KETOROLAC TROMETHAMINE 30 MG/ML IJ SOLN
INTRAMUSCULAR | Status: DC | PRN
  Administered 2017-11-03: 30 mg via INTRAVENOUS

## 2017-11-03 MED ORDER — BELLADONNA ALKALOIDS-OPIUM 16.2-60 MG RE SUPP
RECTAL | Status: DC | PRN
  Administered 2017-11-03: 1 via RECTAL

## 2017-11-03 MED ORDER — DEXAMETHASONE SODIUM PHOSPHATE 10 MG/ML IJ SOLN
INTRAMUSCULAR | Status: AC
  Filled 2017-11-03: qty 1

## 2017-11-03 MED ORDER — ONDANSETRON HCL 4 MG/2ML IJ SOLN
INTRAMUSCULAR | Status: AC
  Filled 2017-11-03: qty 2

## 2017-11-03 MED ORDER — EPHEDRINE SULFATE-NACL 50-0.9 MG/10ML-% IV SOSY
PREFILLED_SYRINGE | INTRAVENOUS | Status: DC | PRN
  Administered 2017-11-03: 20 mg via INTRAVENOUS

## 2017-11-03 MED ORDER — MEPERIDINE HCL 25 MG/ML IJ SOLN
6.2500 mg | INTRAMUSCULAR | Status: DC | PRN
  Filled 2017-11-03: qty 1

## 2017-11-03 MED ORDER — PROPOFOL 10 MG/ML IV BOLUS
INTRAVENOUS | Status: DC | PRN
  Administered 2017-11-03: 150 mg via INTRAVENOUS

## 2017-11-03 MED ORDER — MIDAZOLAM HCL 5 MG/5ML IJ SOLN
INTRAMUSCULAR | Status: DC | PRN
  Administered 2017-11-03: 2 mg via INTRAVENOUS

## 2017-11-03 MED ORDER — IOHEXOL 300 MG/ML  SOLN
INTRAMUSCULAR | Status: DC | PRN
  Administered 2017-11-03: 10 mL via INTRAVENOUS

## 2017-11-03 MED ORDER — LIDOCAINE 2% (20 MG/ML) 5 ML SYRINGE
INTRAMUSCULAR | Status: DC | PRN
  Administered 2017-11-03: 100 mg via INTRAVENOUS

## 2017-11-03 MED ORDER — HYDROMORPHONE HCL 1 MG/ML IJ SOLN
0.2500 mg | INTRAMUSCULAR | Status: DC | PRN
  Filled 2017-11-03: qty 0.5

## 2017-11-03 MED ORDER — CEFAZOLIN SODIUM-DEXTROSE 2-4 GM/100ML-% IV SOLN
2.0000 g | INTRAVENOUS | Status: AC
  Administered 2017-11-03: 2 g via INTRAVENOUS
  Filled 2017-11-03: qty 100

## 2017-11-03 MED ORDER — FENTANYL CITRATE (PF) 100 MCG/2ML IJ SOLN
INTRAMUSCULAR | Status: DC | PRN
  Administered 2017-11-03 (×2): 50 ug via INTRAVENOUS

## 2017-11-03 MED ORDER — MIDAZOLAM HCL 2 MG/2ML IJ SOLN
INTRAMUSCULAR | Status: AC
  Filled 2017-11-03: qty 2

## 2017-11-03 MED ORDER — PHENAZOPYRIDINE HCL 200 MG PO TABS: 200.0000 mg | ORAL_TABLET | Freq: Three times a day (TID) | ORAL | 0 refills | Status: DC | PRN

## 2017-11-03 MED ORDER — TRAMADOL HCL 50 MG PO TABS: 50.0000 mg | ORAL_TABLET | Freq: Four times a day (QID) | ORAL | 0 refills | Status: DC | PRN

## 2017-11-03 MED ORDER — LACTATED RINGERS IV SOLN
INTRAVENOUS | Status: DC
  Administered 2017-11-03 (×2): via INTRAVENOUS
  Filled 2017-11-03: qty 1000

## 2017-11-03 MED ORDER — ONDANSETRON HCL 4 MG/2ML IJ SOLN
4.0000 mg | Freq: Once | INTRAMUSCULAR | Status: DC | PRN
  Filled 2017-11-03: qty 2

## 2017-11-03 MED ORDER — DEXAMETHASONE SODIUM PHOSPHATE 4 MG/ML IJ SOLN
INTRAMUSCULAR | Status: DC | PRN
  Administered 2017-11-03: 10 mg via INTRAVENOUS

## 2017-11-03 MED ORDER — EPHEDRINE 5 MG/ML INJ
INTRAVENOUS | Status: AC
  Filled 2017-11-03: qty 10

## 2017-11-03 MED ORDER — MITOMYCIN CHEMO FOR BLADDER INSTILLATION 40 MG
INTRAVENOUS | Status: DC | PRN
  Administered 2017-11-03: 40 mg via INTRAVESICAL

## 2017-11-03 MED ORDER — PHENAZOPYRIDINE HCL 100 MG PO TABS
ORAL_TABLET | ORAL | Status: AC
  Filled 2017-11-03: qty 2

## 2017-11-03 MED ORDER — MITOMYCIN CHEMO FOR BLADDER INSTILLATION 40 MG
40.0000 mg | Freq: Once | INTRAVENOUS | Status: DC
  Filled 2017-11-03: qty 40

## 2017-11-03 MED FILL — traMADol HCL 50 MG TABS: 50 | 2 days supply | Qty: 15 | Fill #0

## 2017-11-03 SURGICAL SUPPLY — 42 items
BAG DRAIN URO-CYSTO SKYTR STRL (DRAIN) ×3 IMPLANT
BAG URINE DRAINAGE (UROLOGICAL SUPPLIES) ×3 IMPLANT
BASKET LASER NITINOL 1.9FR (BASKET) IMPLANT
BASKET STNLS GEMINI 4WIRE 3FR (BASKET) IMPLANT
BASKET ZERO TIP NITINOL 2.4FR (BASKET) IMPLANT
CATH FOLEY 2WAY SLVR  5CC 18FR (CATHETERS) ×1
CATH FOLEY 2WAY SLVR 5CC 18FR (CATHETERS) ×2 IMPLANT
CATH FOLEY 3WAY 30CC 22FR (CATHETERS) IMPLANT
CATH URET 5FR 28IN OPEN ENDED (CATHETERS) ×3 IMPLANT
CATH URET DUAL LUMEN 6-10FR 50 (CATHETERS) IMPLANT
CLOTH BEACON ORANGE TIMEOUT ST (SAFETY) ×3 IMPLANT
CONNECTOR CATH FOLEY FEMALE LL (CATHETERS) ×3 IMPLANT
DRSG TELFA 3X8 NADH (GAUZE/BANDAGES/DRESSINGS) ×3 IMPLANT
ELECT REM PT RETURN 9FT ADLT (ELECTROSURGICAL)
ELECTRODE REM PT RTRN 9FT ADLT (ELECTROSURGICAL) IMPLANT
EVACUATOR MICROVAS BLADDER (UROLOGICAL SUPPLIES) IMPLANT
EXTRACTOR STONE 1.7FRX115CM (UROLOGICAL SUPPLIES) IMPLANT
FIBER LASER TRAC TIP (UROLOGICAL SUPPLIES) IMPLANT
GLOVE BIO SURGEON STRL SZ7.5 (GLOVE) ×3 IMPLANT
GOWN STRL REUS W/ TWL XL LVL3 (GOWN DISPOSABLE) ×4 IMPLANT
GOWN STRL REUS W/TWL XL LVL3 (GOWN DISPOSABLE) ×5 IMPLANT
GUIDEWIRE 0.038 PTFE COATED (WIRE) IMPLANT
GUIDEWIRE ANG ZIPWIRE 038X150 (WIRE) IMPLANT
GUIDEWIRE STR DUAL SENSOR (WIRE) ×3 IMPLANT
HOLDER FOLEY CATH W/STRAP (MISCELLANEOUS) ×3 IMPLANT
INFUSOR MANOMETER BAG 3000ML (MISCELLANEOUS) IMPLANT
IV NS IRRIG 3000ML ARTHROMATIC (IV SOLUTION) IMPLANT
KIT BALLIN UROMAX 15FX10 (LABEL) IMPLANT
KIT BALLN UROMAX 15FX4 (MISCELLANEOUS) IMPLANT
KIT BALLN UROMAX 26 75X4 (MISCELLANEOUS)
KIT TURNOVER CYSTO (KITS) ×3 IMPLANT
MANIFOLD NEPTUNE II (INSTRUMENTS) ×3 IMPLANT
NS IRRIG 500ML POUR BTL (IV SOLUTION) ×3 IMPLANT
PACK CYSTO (CUSTOM PROCEDURE TRAY) ×3 IMPLANT
PLUG CATH AND CAP STER (CATHETERS) ×3 IMPLANT
SET HIGH PRES BAL DIL (LABEL)
SHEATH ACCESS URETERAL 38CM (SHEATH) IMPLANT
SYR 30ML LL (SYRINGE) IMPLANT
TUBE CONNECTING 12X1/4 (SUCTIONS) ×3 IMPLANT
TUBING UROLOGY SET (TUBING) IMPLANT
WATER STERILE IRR 3000ML UROMA (IV SOLUTION) ×6 IMPLANT
WATER STERILE IRR 500ML POUR (IV SOLUTION) IMPLANT

## 2017-11-03 NOTE — Anesthesia Procedure Notes (Signed)
Procedure Name: LMA Insertion Date/Time: 11/03/2017 1:15 PM Performed by: Lillia Abed, MD Pre-anesthesia Checklist: Patient identified, Emergency Drugs available, Suction available and Patient being monitored Patient Re-evaluated:Patient Re-evaluated prior to induction Oxygen Delivery Method: Circle system utilized Preoxygenation: Pre-oxygenation with 100% oxygen Induction Type: IV induction Ventilation: Mask ventilation without difficulty LMA: LMA inserted LMA Size: 4.0 Number of attempts: 1 Airway Equipment and Method: Bite block Placement Confirmation: positive ETCO2 Tube secured with: Tape Dental Injury: Teeth and Oropharynx as per pre-operative assessment

## 2017-11-03 NOTE — H&P (Signed)
/u for transitional cell carcinoma  HPI: Sheri Mueller is a 67 year-old female established patient who is here for surveillance of bladder cancer.  She underwent a TURBT. Her last bladder tumor was resected 06/24/2010. She has had the a total of 1 bladder resections. The patient had their first TURBT in 10/22/2005.   Tumor Pathology: LG NMIBC.   The patient did not have any post-operative bladder instillations.   Her last cysto was 06/23/2016. His cystoscopy demonstrated NED.   Her last radiologic test to evaluate the kidneys was 02/23/2014. Imaging results: CT - bilateral non-obstructing stone. The patient did not have labs prior to her office visit today.   She is not having pain in new locations. She has had blood in her urine recently. She has not recently had unwanted weight loss.   The patient denies any progressive voiding symptoms. Specifically she denies any dysuria or gross hematuria.   However, the patient does inquire about vaginal flatulence. This occurs intermittently. She also has previously complained of vaginitis and some vaginal drainage. The patient is on oral hormonal therapy with post progesterone and estrogen. She does not have a history of diverticulitis. She had her uterus removed in the 80s.   10/22/17: The patient's fistula resolved with conservative management and she did not need surgery. However, She has had two episodes this week where she had blood in her urine. This is new. She had no other symptoms including no dysuria or associated pain.     ALLERGIES: No Allergies    MEDICATIONS: Calcium TABS Oral  Estradiol 1 mg tablet  Fish Oil CAPS Oral  Fluoxetine Hcl  Losartan Potassium 100 MG Oral Tablet Oral  Vitamin B Complex TABS Oral     Notes: testosterone 0.75 mg- compounded in estradiol/progesterone   GU PSH: Cystoscopy - 06/23/2016 Cystoscopy TURBT 2-5 cm - 2012 Hysterectomy Unilat SO - 2012 Ovary Removal Partial or Total - 2012      PSH Notes: Cystoscopy  With Fulguration Medium Lesion (2-5cm), Hand Surgery, Cystoscopy Bladder Tumor, Hysterectomy, Corneal LASIK, Appendectomy, Oophorectomy   NON-GU PSH: Appendectomy - 2012    GU PMH: Bladder Cancer Lateral, The patient has a history of low-grade non-muscle invasive bladder cancer. She has been NED since her diagnosis in 2012. Her cystoscopy today was normal. 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 - 06/23/2016 Renal calculus, The patient has nonobstructing kidney stones that were seen on a CT scan in 2015. She is asymptomatic. Our plan is to treat these conservatively. - 06/23/2016 Bladder Cancer, Unspec, Bladder cancer - 2015 Bladder, Neoplasm of Unspecified behavior, Neoplasm Of The Bladder - 2014 Hematuria, Unspec, Hematuria - 2014 History of urolithiasis, Nephrolithiasis - 2014 Urinary Tract Inf, Unspec site, Urinary tract infection - 2014      PMH Notes:  2010-10-09 13:25:33 - Note: Arthritis   NON-GU PMH: Encounter for general adult medical examination without abnormal findings, Encounter for preventive health examination - 2017 Anxiety, Anxiety (Symptom) - 2014 Personal history of other diseases of the circulatory system, History of hypertension - 2014    FAMILY HISTORY: Atrial Fibrillation - Mother Family Health Status - Father alive at age 71 - 68 In Family Family Health Status - Mother's Age - Runs In Family Family Health Status Number - Runs In Family Hematuria - Mother Hypertension - Brother, Mother, Father nephrolithiasis - Runs In Family osteopenia -  Mother, Runs In Family Renal Cell Carcinoma - Runs In Family Transient Ischemic Attack - Runs In Family, Mother   SOCIAL HISTORY: Marital Status: Divorced Preferred Language: English; Ethnicity: Not Hispanic Or Latino; Race: White     Notes: Caffeine Use, Never A Smoker, Occupation:, Alcohol Use,  Marital History - Divorced   REVIEW OF SYSTEMS:    GU Review Female:   Patient denies frequent urination, hard to postpone urination, burning /pain with urination, get up at night to urinate, leakage of urine, stream starts and stops, trouble starting your stream, have to strain to urinate, and being pregnant.  Gastrointestinal (Upper):   Patient denies nausea, vomiting, and indigestion/ heartburn.  Gastrointestinal (Lower):   Patient denies diarrhea and constipation.  Constitutional:   Patient denies fever, night sweats, weight loss, and fatigue.  Skin:   Patient denies skin rash/ lesion and itching.  Eyes:   Patient denies blurred vision and double vision.  Ears/ Nose/ Throat:   Patient denies sore throat and sinus problems.  Hematologic/Lymphatic:   Patient denies swollen glands and easy bruising.  Cardiovascular:   Patient denies leg swelling and chest pains.  Respiratory:   Patient denies cough and shortness of breath.  Endocrine:   Patient denies excessive thirst.  Musculoskeletal:   Patient denies back pain and joint pain.  Neurological:   Patient denies headaches and dizziness.  Psychologic:   Patient denies anxiety and depression.   VITAL SIGNS:      10/22/2017 02:04 PM 10/22/2017 01:43 PM  Weight   138 lb / 62.6 kg  Height   66 in / 167.64 cm  BP 83/52 mmHg 131/74 mmHg  Pulse 71 /min 102 /min  BMI 22.3 kg/m   GU PHYSICAL EXAMINATION:    Urethra: No tenderness, no mass, no scarring. No hypermobility. No leakage.  Vagina: No atrophy, no stenosis. No rectocele. No cystocele. No enterocele.   MULTI-SYSTEM PHYSICAL EXAMINATION:    Constitutional: Well-nourished. No physical deformities. Normally developed. Good grooming.  Respiratory: No labored breathing, no use of accessory muscles. Normal breath sounds.  Cardiovascular: Regular rate and rhythm. No murmur, no gallop. Normal temperature, normal extremity pulses, no swelling, no varicosities.  Gastrointestinal: No mass, no  tenderness, no rigidity, non obese abdomen.     PAST DATA REVIEWED:  Source Of History:  Patient  Records Review:   Pathology Reports, Previous Doctor Records, Previous Patient Records, POC Tool   PROCEDURES:         Flexible Cystoscopy - 52000  Risks, benefits, and some of the potential complications of the procedure were discussed at length with the patient including infection, bleeding, voiding discomfort, urinary retention, fever, chills, sepsis, and others. All questions were answered. Informed consent was obtained. Antibiotic prophylaxis was given. Sterile technique and intraurethral analgesia were used.  Meatus:  Normal size. Normal location. Normal condition.  Urethra:  No hypermobility. No leakage.  Ureteral Orifices:  Normal location. Normal size. Normal shape. Effluxed clear urine.  Bladder:  3-21mm lesion on right posterior wall.      The lower urinary tract was carefully examined. The procedure was well-tolerated and without complications. Antibiotic instructions were given. Instructions were given to call the office immediately for bloody urine, difficulty urinating, urinary retention, painful or frequent urination, fever, chills, nausea, vomiting or other illness. The patient stated that she understood these instructions and would comply with them.         Urinalysis w/Scope Dipstick Dipstick Cont'd Micro  Color: Straw Bilirubin: Neg WBC/hpf: NS (Not  Seen)  Appearance: Clear Ketones: Neg RBC/hpf: 0 - 2/hpf  Specific Gravity: <=1.005 Blood: 2+ Bacteria: NS (Not Seen)  pH: 5.5 Protein: Neg Cystals: NS (Not Seen)  Glucose: Neg Urobilinogen: 0.2 Casts: NS (Not Seen)    Nitrites: Neg Trichomonas: Not Present    Leukocyte Esterase: Neg Mucous: Not Present      Epithelial Cells: 0 - 5/hpf      Yeast: NS (Not Seen)      Sperm: Not Present    ASSESSMENT:      ICD-10 Details  1 GU:   Bladder Cancer Posterior - C67.4    PLAN:           Document Letter(s):  Created for  Patient: Clinical Summary         Notes:   The patient has a very small recurrence of her bladder cancer at the right posterior bladder wall, measuring approximately 3-5 mm in size. As the patient, offered a biopsy at urine clinic, but she declined. Instead, we have opted to proceed to the operating room for resection. We will also perform retrograde pyelograms bilaterally. Further, I will try to instill postoperative mitomycin C. I discussed this with the patient detail including all the associated risks and benefits. After she had a chance to ask all the questions she has opted to proceed.

## 2017-11-03 NOTE — Discharge Instructions (Signed)
Transurethral Resection of Bladder Tumor (TURBT) or Bladder Biopsy   Definition:  Transurethral Resection of the Bladder Tumor is a surgical procedure used to diagnose and remove tumors within the bladder. TURBT is the most common treatment for early stage bladder cancer.  General instructions:     Your recent bladder surgery requires very little post hospital care but some definite precautions.  Despite the fact that no skin incisions were used, the area around the bladder incisions are raw and covered with scabs to promote healing and prevent bleeding. Certain precautions are needed to insure that the scabs are not disturbed over the next 2-4 weeks while the healing proceeds.  Because the raw surface inside your bladder and the irritating effects of urine you may expect frequency of urination and/or urgency (a stronger desire to urinate) and perhaps even getting up at night more often. This will usually resolve or improve slowly over the healing period. You may see some blood in your urine over the first 6 weeks. Do not be alarmed, even if the urine was clear for a while. Get off your feet and drink lots of fluids until clearing occurs. If you start to pass clots or don't improve call us.  Diet:  You may return to your normal diet immediately. Because of the raw surface of your bladder, alcohol, spicy foods, foods high in acid and drinks with caffeine may cause irritation or frequency and should be used in moderation. To keep your urine flowing freely and avoid constipation, drink plenty of fluids during the day (8-10 glasses). Tip: Avoid cranberry juice because it is very acidic.  Activity:  Your physical activity doesn't need to be restricted. However, if you are very active, you may see some blood in the urine. We suggest that you reduce your activity under the circumstances until the bleeding has stopped.  Bowels:  It is important to keep your bowels regular during the postoperative  period. Straining with bowel movements can cause bleeding. A bowel movement every other day is reasonable. Use a mild laxative if needed, such as milk of magnesia 2-3 tablespoons, or 2 Dulcolax tablets. Call if you continue to have problems. If you had been taking narcotics for pain, before, during or after your surgery, you may be constipated. Take a laxative if necessary.    Medication:  You should resume your pre-surgery medications unless told not to. In addition you may be given an antibiotic to prevent or treat infection. Antibiotics are not always necessary. All medication should be taken as prescribed until the bottles are finished unless you are having an unusual reaction to one of the drugs.     NO ADVIL, ALEVE, IBUPROFEN OR OTHER NSAIDS UNTIL 1925 PM TODAY     Post Anesthesia Home Care Instructions  Activity: Get plenty of rest for the remainder of the day. A responsible individual must stay with you for 24 hours following the procedure.  For the next 24 hours, DO NOT: -Drive a car -Paediatric nurse -Drink alcoholic beverages -Take any medication unless instructed by your physician -Make any legal decisions or sign important papers.  Meals: Start with liquid foods such as gelatin or soup. Progress to regular foods as tolerated. Avoid greasy, spicy, heavy foods. If nausea and/or vomiting occur, drink only clear liquids until the nausea and/or vomiting subsides. Call your physician if vomiting continues.  Special Instructions/Symptoms: Your throat may feel dry or sore from the anesthesia or the breathing tube placed in your throat during surgery. If  this causes discomfort, gargle with warm salt water. The discomfort should disappear within 24 hours.  If you had a scopolamine patch placed behind your ear for the management of post- operative nausea and/or vomiting:  1. The medication in the patch is effective for 72 hours, after which it should be removed.  Wrap patch in a  tissue and discard in the trash. Wash hands thoroughly with soap and water. 2. You may remove the patch earlier than 72 hours if you experience unpleasant side effects which may include dry mouth, dizziness or visual disturbances. 3. Avoid touching the patch. Wash your hands with soap and water after contact with the patch.

## 2017-11-03 NOTE — Op Note (Signed)
Preoperative diagnosis:  1. Recurrent bladder cancer  Postoperative diagnosis:  1. Same  Procedure: 1. Transurethral resection of bladder tumor 0.5 cm 2. Retrograde pyelogram bilaterally with interpretation 3. Postoperative instillation of mitomycin-C  Surgeon: Ardis Hughs, MD  Anesthesia: General  Complications: None  Intraoperative findings:  #1: The right retrograde pyelogram was performed using 10 cc of Omnipaque contrast instilled into the right distal ureter.  This demonstrated normal caliber ureter with no filling defects.  The right renal collecting system was sharp with no blunting of the calyces or hydroureteronephrosis.  There is no filling defect.  2.:  The left retrograde pyelogram was performed using 10 cc of Omnipaque contrast.  This demonstrated normal caliber left ureter with no filling defects.  The renal pelvis was normal without any filling defects or hydronephrosis.  The calyces were sharp and normal-appearing.  3.:  The patient had a 5 mm low-grade appearing tumor on a narrow stalk in the right posterior wall of the bladder.  3.:  40 cc of mitomycin-C was instilled the patient the PACU.  EBL: Minimal  Specimens: None  Indication: Sheri Mueller is a 67 y.o. patient with history of low-grade bladder cancer.  She was seen for routine cystoscopic evaluation was noted to have recurrence of her cancer..  After reviewing the management options for treatment, he elected to proceed with the above surgical procedure(s). We have discussed the potential benefits and risks of the procedure, side effects of the proposed treatment, the likelihood of the patient achieving the goals of the procedure, and any potential problems that might occur during the procedure or recuperation. Informed consent has been obtained.  Description of procedure:  The patient was taken to the operating room and general anesthesia was induced.  The patient was placed in the dorsal lithotomy  position, prepped and draped in the usual sterile fashion, and preoperative antibiotics were administered. A preoperative time-out was performed.   A 30 degree 21 French cystoscope was gently passed to the patient's urethra and the bladder visual guidance.  The 30 degree lens was then exchanged for 70 degree lens and is 36 degrees cystoscopic evaluation was performed.  The previously noted tumor was again easily located.  There are no other significant abnormalities noted.  30 degrees scope was then reinserted into the patient's bladder with the rigid bladder biopsy forceps.  The tumor was then grasped and removed.  The base of the tumor and the edges of the tumor were then fulgurated.  Specimen was sent for permanent pathology.  Retrograde pyelograms were then performed as described above, bilaterally.  The scope was then removed and a 18 French Foley catheter inserted.  The bladder was drained and the patient was awoken.  A B&O suppository was placed in the rectum.  In the PACU, 40 mL's with 40 mg of mitomycin-C was instilled into the patient's bladder through an 69 French Foley catheter.  This was allowed to dwell for 60-90 minutes prior to emptying the Foley catheter and removing it.   Ardis Hughs, M.D.

## 2017-11-03 NOTE — Anesthesia Postprocedure Evaluation (Signed)
Anesthesia Post Note  Patient: Sheri Mueller  Procedure(s) Performed: TRANSURETHRAL RESECTION OF BLADDER TUMOR WITH MITOMYCIN-C (N/A ) BILATERAL  RETROGRADE PYELOGRAM (Bilateral )     Patient location during evaluation: PACU Anesthesia Type: General Level of consciousness: awake and alert Pain management: pain level controlled Vital Signs Assessment: post-procedure vital signs reviewed and stable Respiratory status: spontaneous breathing, nonlabored ventilation, respiratory function stable and patient connected to nasal cannula oxygen Cardiovascular status: blood pressure returned to baseline and stable Postop Assessment: no apparent nausea or vomiting Anesthetic complications: no    Last Vitals:  Vitals:   11/03/17 1415 11/03/17 1430  BP: (!) 141/57 (!) 141/72  Pulse: (!) 58 67  Resp: 12 17  Temp:    SpO2: 100% 100%    Last Pain:  Vitals:   11/03/17 1415  TempSrc:   PainSc: 0-No pain                 Eliezer Khawaja DAVID

## 2017-11-03 NOTE — Anesthesia Preprocedure Evaluation (Signed)
Anesthesia Evaluation  Patient identified by MRN, date of birth, ID band Patient awake    Reviewed: Allergy & Precautions, NPO status   History of Anesthesia Complications (+) PONV  Airway Mallampati: I  TM Distance: >3 FB Neck ROM: Full    Dental   Pulmonary    Pulmonary exam normal        Cardiovascular hypertension, Pt. on medications Normal cardiovascular exam     Neuro/Psych Anxiety    GI/Hepatic   Endo/Other  Hypothyroidism   Renal/GU      Musculoskeletal   Abdominal   Peds  Hematology   Anesthesia Other Findings   Reproductive/Obstetrics                             Anesthesia Physical Anesthesia Plan  ASA: II  Anesthesia Plan: General   Post-op Pain Management:    Induction:   PONV Risk Score and Plan: 3 and Midazolam, Dexamethasone, Ondansetron and Treatment may vary due to age or medical condition  Airway Management Planned: LMA  Additional Equipment:   Intra-op Plan:   Post-operative Plan: Extubation in OR  Informed Consent: I have reviewed the patients History and Physical, chart, labs and discussed the procedure including the risks, benefits and alternatives for the proposed anesthesia with the patient or authorized representative who has indicated his/her understanding and acceptance.     Plan Discussed with: CRNA and Surgeon  Anesthesia Plan Comments:         Anesthesia Quick Evaluation

## 2017-11-03 NOTE — Transfer of Care (Signed)
Last Vitals:  Vitals Value Taken Time  BP    Temp    Pulse    Resp    SpO2      Last Pain:  Vitals:   11/03/17 1139  TempSrc: Oral         Immediate Anesthesia Transfer of Care Note  Patient: Sheri Mueller  Procedure(s) Performed: Procedure(s) (LRB): TRANSURETHRAL RESECTION OF BLADDER TUMOR WITH MITOMYCIN-C (N/A) BILATERAL  RETROGRADE PYELOGRAM (Bilateral)  Patient Location: PACU  Anesthesia Type: General  Level of Consciousness: awake, alert  and oriented  Airway & Oxygen Therapy: Patient Spontanous Breathing and Patient connected to nasal cannula oxygen  Post-op Assessment: Report given to PACU RN and Post -op Vital signs reviewed and stable  Post vital signs: Reviewed and stable  Complications: No apparent anesthesia complications

## 2017-11-04 ENCOUNTER — Encounter (HOSPITAL_BASED_OUTPATIENT_CLINIC_OR_DEPARTMENT_OTHER): Payer: Self-pay | Admitting: Urology

## 2018-04-24 IMAGING — CR DG ANKLE COMPLETE 3+V*R*
3 series · 3 of 3 positions shown · non-contrast
Comparison: 01/27/2017.

CLINICAL DATA: Follow-up fracture.

EXAM:
RIGHT ANKLE - COMPLETE 3+ VIEW

[t ankle joint ap right]
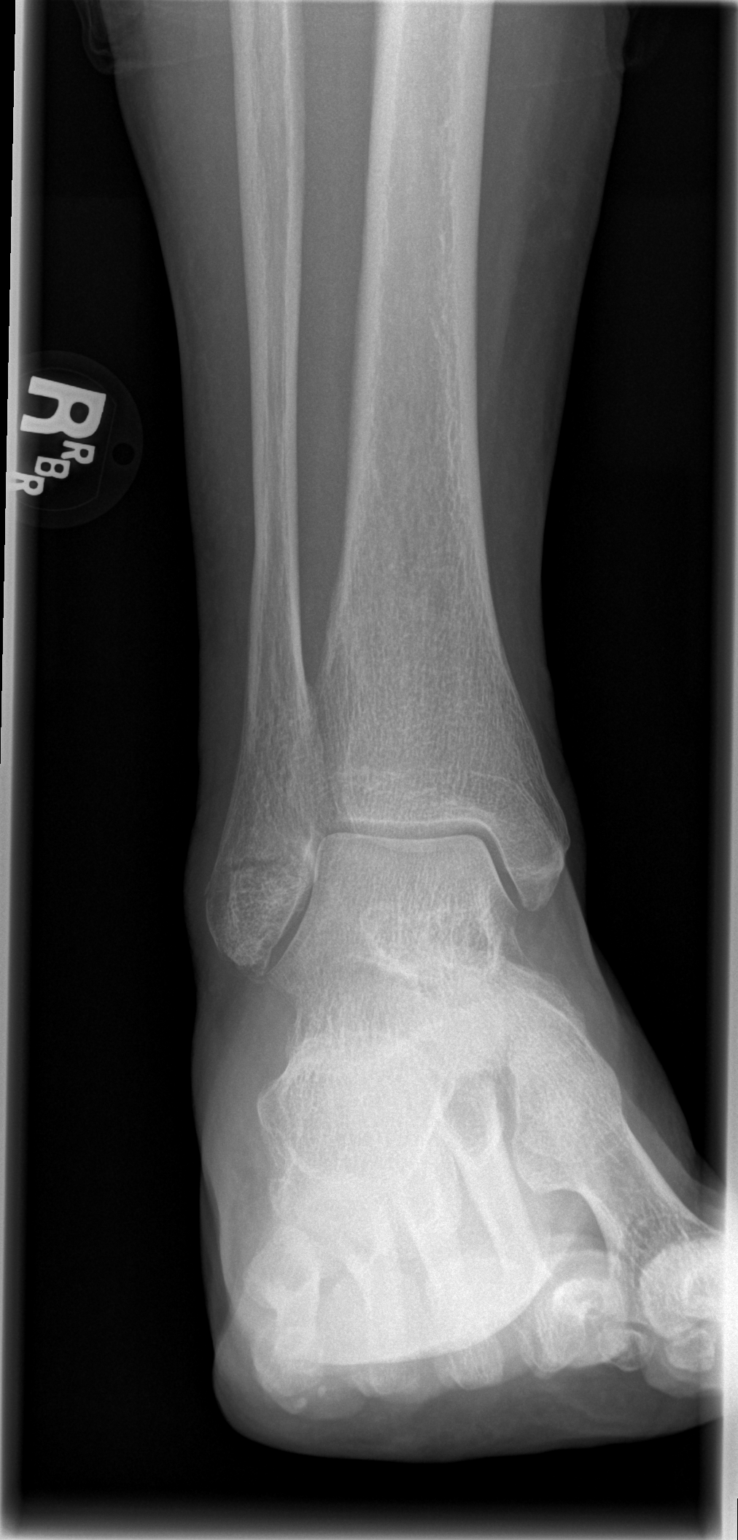

[t ankle joint oblique right]
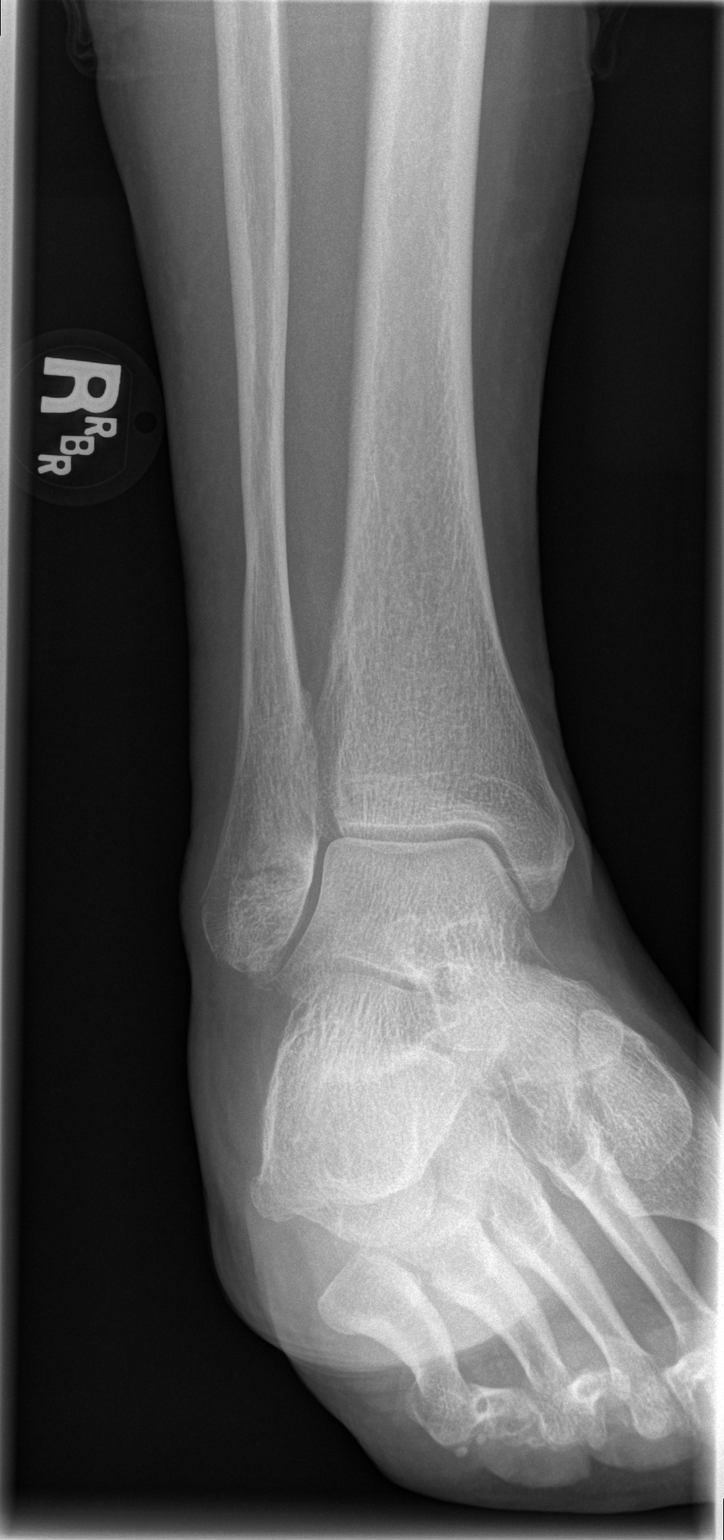

[t ankle joint lat right]
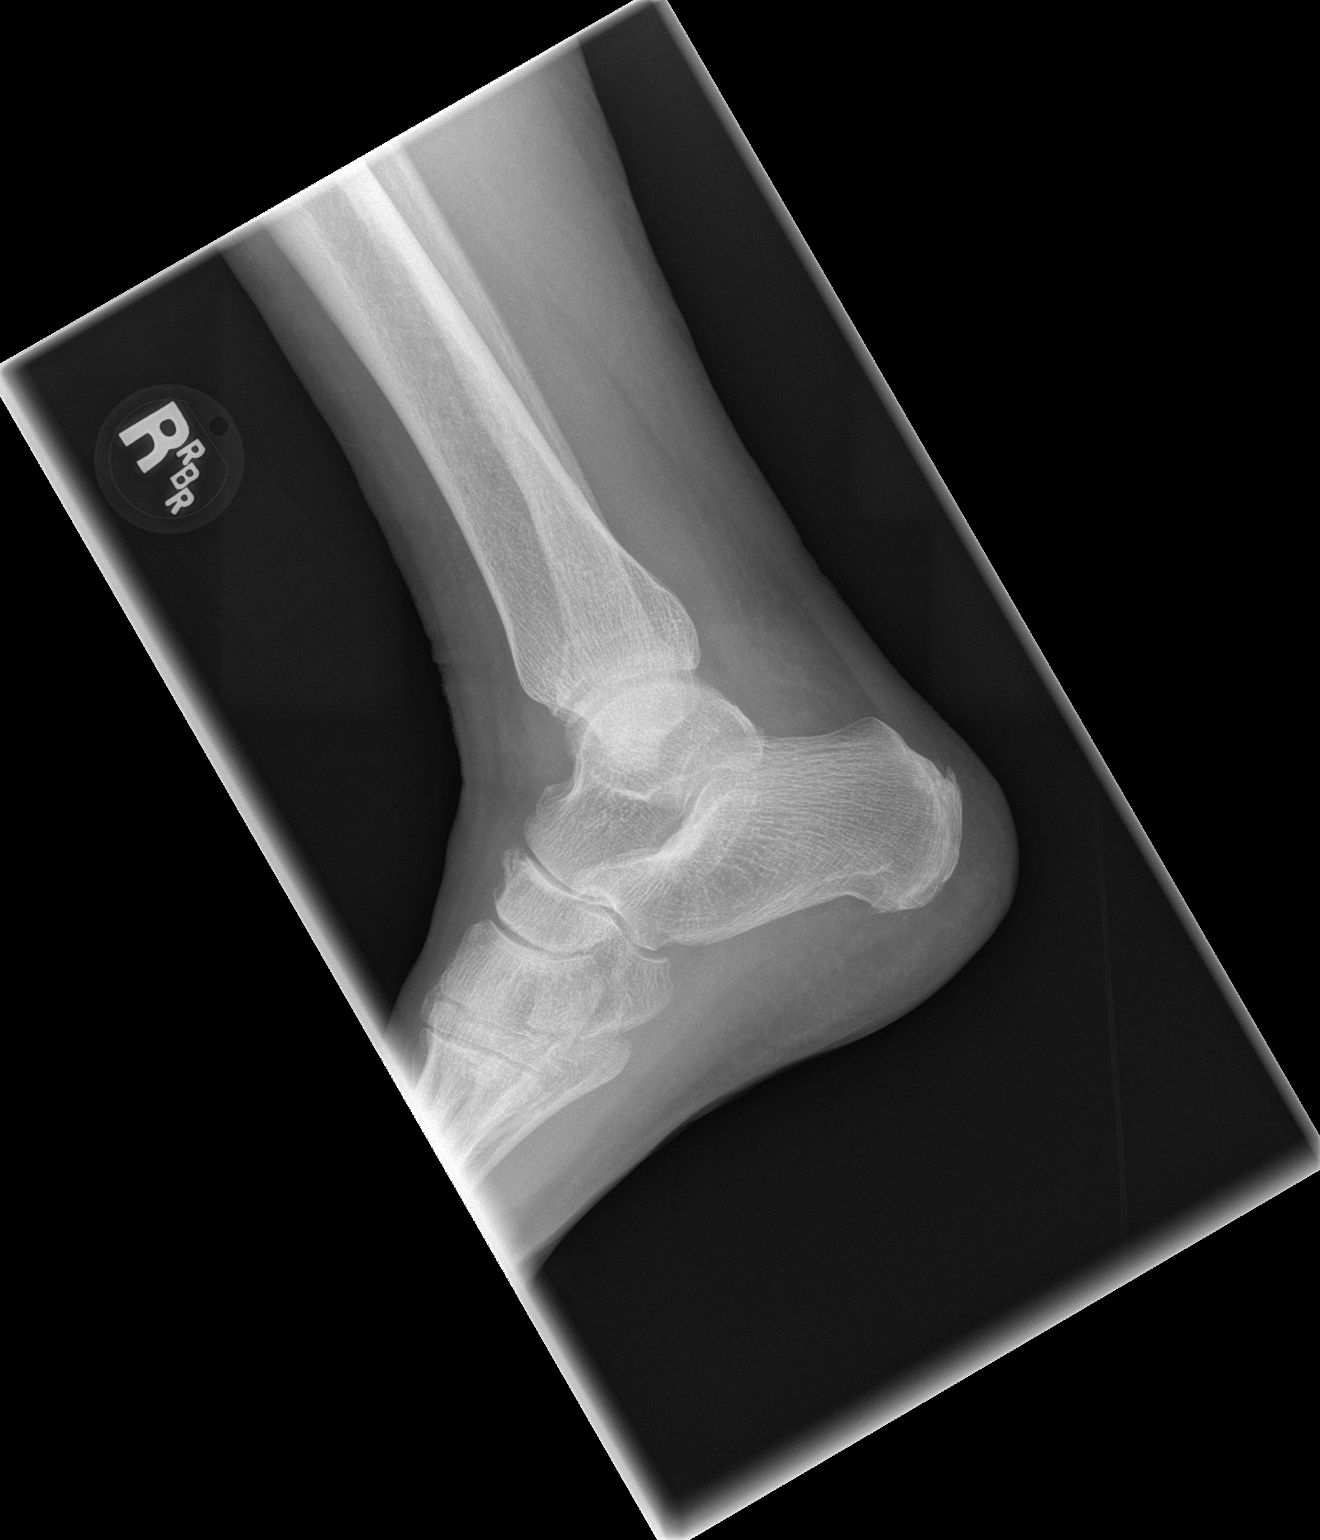

[3 of 3 positions shown; findings below may reference images not displayed]

FINDINGS: Nondisplaced fractures of the lateral malleolus again noted. Similar
findings noted on prior exam. No interim change. No prominent callus
formation.
IMPRESSION: Nondisplaced fractures of the mid lateral malleolus again noted
without interim change .

## 2018-06-01 ENCOUNTER — Ambulatory Visit (INDEPENDENT_AMBULATORY_CARE_PROVIDER_SITE_OTHER): Payer: Medicare Other | Admitting: Licensed Clinical Social Worker

## 2018-06-01 DIAGNOSIS — F331 Major depressive disorder, recurrent, moderate: Secondary | ICD-10-CM | POA: Diagnosis not present

## 2018-06-07 ENCOUNTER — Ambulatory Visit (INDEPENDENT_AMBULATORY_CARE_PROVIDER_SITE_OTHER): Payer: Medicare Other | Admitting: Licensed Clinical Social Worker

## 2018-06-07 DIAGNOSIS — F331 Major depressive disorder, recurrent, moderate: Secondary | ICD-10-CM | POA: Diagnosis not present

## 2018-06-14 ENCOUNTER — Ambulatory Visit (INDEPENDENT_AMBULATORY_CARE_PROVIDER_SITE_OTHER): Payer: Medicare Other | Admitting: Licensed Clinical Social Worker

## 2018-06-14 DIAGNOSIS — F3341 Major depressive disorder, recurrent, in partial remission: Secondary | ICD-10-CM | POA: Diagnosis not present

## 2018-06-23 ENCOUNTER — Ambulatory Visit: Payer: Medicare Other | Admitting: Licensed Clinical Social Worker

## 2018-06-28 ENCOUNTER — Ambulatory Visit: Payer: Medicare Other | Admitting: Licensed Clinical Social Worker

## 2019-06-14 ENCOUNTER — Ambulatory Visit: Payer: Self-pay

## 2020-03-22 ENCOUNTER — Ambulatory Visit: Payer: Self-pay

## 2020-05-03 DIAGNOSIS — E063 Autoimmune thyroiditis: Secondary | ICD-10-CM | POA: Diagnosis not present

## 2020-05-03 DIAGNOSIS — E039 Hypothyroidism, unspecified: Secondary | ICD-10-CM | POA: Diagnosis not present

## 2020-05-03 DIAGNOSIS — R7989 Other specified abnormal findings of blood chemistry: Secondary | ICD-10-CM | POA: Diagnosis not present

## 2020-05-03 DIAGNOSIS — E559 Vitamin D deficiency, unspecified: Secondary | ICD-10-CM | POA: Diagnosis not present

## 2020-05-03 DIAGNOSIS — E782 Mixed hyperlipidemia: Secondary | ICD-10-CM | POA: Diagnosis not present

## 2020-08-19 DIAGNOSIS — I10 Essential (primary) hypertension: Secondary | ICD-10-CM | POA: Diagnosis not present

## 2020-08-27 DIAGNOSIS — E559 Vitamin D deficiency, unspecified: Secondary | ICD-10-CM | POA: Diagnosis not present

## 2020-08-27 DIAGNOSIS — E039 Hypothyroidism, unspecified: Secondary | ICD-10-CM | POA: Diagnosis not present

## 2020-08-27 DIAGNOSIS — E063 Autoimmune thyroiditis: Secondary | ICD-10-CM | POA: Diagnosis not present

## 2020-08-27 DIAGNOSIS — R7989 Other specified abnormal findings of blood chemistry: Secondary | ICD-10-CM | POA: Diagnosis not present

## 2020-08-27 DIAGNOSIS — R5383 Other fatigue: Secondary | ICD-10-CM | POA: Diagnosis not present

## 2020-11-21 DIAGNOSIS — D485 Neoplasm of uncertain behavior of skin: Secondary | ICD-10-CM | POA: Diagnosis not present

## 2020-11-21 DIAGNOSIS — Z1283 Encounter for screening for malignant neoplasm of skin: Secondary | ICD-10-CM | POA: Diagnosis not present

## 2020-11-21 DIAGNOSIS — B351 Tinea unguium: Secondary | ICD-10-CM | POA: Diagnosis not present

## 2020-11-21 DIAGNOSIS — L986 Other infiltrative disorders of the skin and subcutaneous tissue: Secondary | ICD-10-CM | POA: Diagnosis not present

## 2020-11-21 DIAGNOSIS — L218 Other seborrheic dermatitis: Secondary | ICD-10-CM | POA: Diagnosis not present

## 2020-11-21 DIAGNOSIS — D225 Melanocytic nevi of trunk: Secondary | ICD-10-CM | POA: Diagnosis not present

## 2020-11-21 DIAGNOSIS — L814 Other melanin hyperpigmentation: Secondary | ICD-10-CM | POA: Diagnosis not present

## 2020-12-12 DIAGNOSIS — L988 Other specified disorders of the skin and subcutaneous tissue: Secondary | ICD-10-CM | POA: Diagnosis not present

## 2020-12-12 DIAGNOSIS — L989 Disorder of the skin and subcutaneous tissue, unspecified: Secondary | ICD-10-CM | POA: Diagnosis not present

## 2020-12-12 DIAGNOSIS — L720 Epidermal cyst: Secondary | ICD-10-CM | POA: Diagnosis not present

## 2020-12-12 DIAGNOSIS — D485 Neoplasm of uncertain behavior of skin: Secondary | ICD-10-CM | POA: Diagnosis not present

## 2020-12-26 DIAGNOSIS — E782 Mixed hyperlipidemia: Secondary | ICD-10-CM | POA: Diagnosis not present

## 2020-12-26 DIAGNOSIS — R7989 Other specified abnormal findings of blood chemistry: Secondary | ICD-10-CM | POA: Diagnosis not present

## 2020-12-26 DIAGNOSIS — E559 Vitamin D deficiency, unspecified: Secondary | ICD-10-CM | POA: Diagnosis not present

## 2020-12-26 DIAGNOSIS — E039 Hypothyroidism, unspecified: Secondary | ICD-10-CM | POA: Diagnosis not present

## 2020-12-26 DIAGNOSIS — E063 Autoimmune thyroiditis: Secondary | ICD-10-CM | POA: Diagnosis not present

## 2021-01-02 DIAGNOSIS — D485 Neoplasm of uncertain behavior of skin: Secondary | ICD-10-CM | POA: Diagnosis not present

## 2021-01-02 DIAGNOSIS — L988 Other specified disorders of the skin and subcutaneous tissue: Secondary | ICD-10-CM | POA: Diagnosis not present

## 2021-01-21 DIAGNOSIS — Z961 Presence of intraocular lens: Secondary | ICD-10-CM | POA: Diagnosis not present

## 2021-01-21 DIAGNOSIS — H5212 Myopia, left eye: Secondary | ICD-10-CM | POA: Diagnosis not present

## 2021-01-30 DIAGNOSIS — Z961 Presence of intraocular lens: Secondary | ICD-10-CM | POA: Diagnosis not present

## 2021-02-20 DIAGNOSIS — E7800 Pure hypercholesterolemia, unspecified: Secondary | ICD-10-CM | POA: Diagnosis not present

## 2021-02-20 DIAGNOSIS — E782 Mixed hyperlipidemia: Secondary | ICD-10-CM | POA: Diagnosis not present

## 2021-02-20 DIAGNOSIS — Z0000 Encounter for general adult medical examination without abnormal findings: Secondary | ICD-10-CM | POA: Diagnosis not present

## 2021-02-20 DIAGNOSIS — E039 Hypothyroidism, unspecified: Secondary | ICD-10-CM | POA: Diagnosis not present

## 2021-03-03 DIAGNOSIS — J069 Acute upper respiratory infection, unspecified: Secondary | ICD-10-CM | POA: Diagnosis not present

## 2021-03-03 DIAGNOSIS — H6502 Acute serous otitis media, left ear: Secondary | ICD-10-CM | POA: Diagnosis not present

## 2021-03-10 DIAGNOSIS — H10029 Other mucopurulent conjunctivitis, unspecified eye: Secondary | ICD-10-CM | POA: Diagnosis not present

## 2021-03-25 ENCOUNTER — Other Ambulatory Visit: Payer: Self-pay

## 2021-03-25 ENCOUNTER — Ambulatory Visit
Admission: RE | Admit: 2021-03-25 | Discharge: 2021-03-25 | Disposition: A | Discharge status: Home / Self Care | Payer: Medicare Other | Source: Ambulatory Visit | Attending: Emergency Medicine | Admitting: Emergency Medicine

## 2021-03-25 VITALS — BP 142/76 | HR 76 | Temp 97.6°F | Resp 19

## 2021-03-25 DIAGNOSIS — H6982 Other specified disorders of Eustachian tube, left ear: Secondary | ICD-10-CM | POA: Diagnosis not present

## 2021-03-25 MED ORDER — IPRATROPIUM BROMIDE 0.06 % NA SOLN: 2.0000 | Freq: Four times a day (QID) | NASAL | 12 refills | Status: DC

## 2021-03-25 MED ORDER — FLUTICASONE PROPIONATE 50 MCG/ACT NA SUSP: 2.0000 | Freq: Every day | NASAL | 0 refills | Status: DC

## 2021-03-25 NOTE — Discharge Instructions (Addendum)
I believe that the symptoms that you are experiencing your left ear are related to eustachian tube dysfunction which are secondary to some mild allergic rhinitis.  Allergic rhinitis can also cause that postnasal drip and tickling her throat.  To treat this, I agree with your primary care provider that you should be using Flonase daily.  Flonase is a topical steroid which provides long-term relief of mild allergic rhinitis that does not require treatment with oral antihistamines.  Because you have been experiencing nosebleeds after using this medication in the past, I recommend that you only use 1 spray daily in each nostril.  To provide more rapid relief of nasal congestion and sinusitis, I recommend that she begin using Atrovent nasal spray, 2 sprays in each nare up to 4 times daily for congestion, runny nose and fullness in the ear.  As we discussed, your sinuses, your nasal passages, the back of your throat and your eustachian tube are all interrelated and interconnected.  When area is not working, the other areas will complain.  I have enclosed some general information about eustachian tube dysfunction so you could know a little bit more about what is going on.  Also as we discussed, because you have an upcoming flight and are concerned about difficulty with your trip, I recommend that you make an appointment with an ear nose and throat provider so that you can have an appointment in hand if the plan we discussed is not effective for you.  Thank you for visiting urgent care today, I wish you a happy holiday and a safe trip.

## 2021-03-25 NOTE — ED Provider Notes (Signed)
[facility]    CSN: [id number] Arrival date & time: 03/25/21  3151    HISTORY   Chief Complaint  Patient presents with   Appointment    0930   HPI Sheri Mueller is a 70 y.o. female. Patient complains of a sensation of fullness in her left ear, patient reports that over the past month, she has had sore throat, congestion, amoxicillin and pinkeye, states overall she is feeling much better at this time however continues to have intermittent pain and discomfort in her left ear.  Patient states she is not had any loss of hearing, states she still has some intermittent nasal drainage, states she feels like she has to blow her nose very hard to clear it, states sometimes when she does that she notices a squeaking or popping sensation in her left eardrum.  Patient states that 3 This morning, she woke up with left ear pain, states it is feeling better at this time.  Patient states she is been taking ibuprofen for her symptoms.  Patient states that several months ago she flew to [place], experienced intense pain in her left ear that resolved when she landed.   Past Medical History:  Diagnosis Date   Anxiety and depression    Arthritis    Hands   Aura    with stress and after flu shot 2018   Bilateral cataracts    Bilateral hip pain    Bladder cancer ([place])    Bursitis of left elbow    Closed right ankle fracture 01/27/2017   Elevated ferritin    resolved   History of kidney stones    Hypertension    Hypothyroidism    Insomnia    Ischial bursitis    Left hip   PONV (postoperative nausea and vomiting)    Urinary tract infection    Patient Active Problem List   Diagnosis Date Noted   Left hip pain 02/03/2017   Right ankle injury, subsequent encounter 01/28/2017   Past Surgical History:  Procedure Laterality Date   APPENDECTOMY  1977   BLADDER SURGERY     tumor removal   BLADDER SURGERY     tumor removal about 30 years   COLONOSCOPY     CYSTOSCOPY     CYSTOSCOPY  W/ RETROGRADES Bilateral 11/03/2017   Procedure: BILATERAL  RETROGRADE PYELOGRAM;  Surgeon: Ardis Hughs, MD;  Location: [facility];  Service: Urology;  Laterality: Bilateral;   FINGER SURGERY  2003   dupuytren's contracture   LASIK  2005   OVARIAN CYST REMOVAL  1982   TRANSURETHRAL RESECTION OF BLADDER TUMOR WITH MITOMYCIN-C N/A 11/03/2017   Procedure: TRANSURETHRAL RESECTION OF BLADDER TUMOR WITH MITOMYCIN-C;  Surgeon: Ardis Hughs, MD;  Location: [facility];  Service: Urology;  Laterality: N/A;   VAGINAL HYSTERECTOMY  1990's   OB History   No obstetric history on file.    Home Medications    Prior to Admission medications   Medication Sig Start Date End Date Taking? Authorizing Provider  fluticasone (FLONASE) 50 MCG/ACT nasal spray Place 2 sprays into both nostrils daily. 03/25/21  Yes Lynden Oxford Scales, PA-C  ipratropium (ATROVENT) 0.06 % nasal spray Place 2 sprays into both nostrils 4 (four) times daily. As needed for nasal congestion, runny nose 03/25/21  Yes Lynden Oxford Scales, PA-C  Flax OIL Take 10 mLs by mouth daily.     [provider]  FLUoxetine (PROZAC) 10 MG capsule Take 10 mg by mouth  daily.    [provider]  losartan (COZAAR) 100 MG tablet Take 100 mg by mouth daily.    [provider]  phenazopyridine (PYRIDIUM) 200 MG tablet Take 1 tablet (200 mg total) by mouth 3 (three) times daily as needed for pain. 11/03/17   Ardis Hughs, MD  Pyridoxal-5 Phosphate POWD by Does not apply route.    [provider]  thyroid (NP THYROID) 15 MG tablet Take 15 mg by mouth daily.    [provider]  traMADol (ULTRAM) 50 MG tablet Take 1-2 tablets (50-100 mg total) by mouth every 6 (six) hours as needed for moderate pain. 11/03/17   Ardis Hughs, MD  UNABLE TO FIND Med Name:estradiol-testosterone-progesterone    [provider]   Family History Family History  Problem  Relation Age of Onset   Congestive Heart Failure Mother    Congestive Heart Failure Maternal Grandmother    Social History Social History   Tobacco Use   Smoking status: Never   Smokeless tobacco: Never  Vaping Use   Vaping Use: Never used  Substance Use Topics   Alcohol use: Yes    Comment: rare   Drug use: No   Allergies   Lisinopril  Review of Systems Review of Systems Pertinent findings noted in history of present illness.   Physical Exam Triage Vital Signs ED Triage Vitals  Enc Vitals Group     BP 02/14/21 0827 (!) 147/82     Pulse Rate 02/14/21 0827 72     Resp 02/14/21 0827 18     Temp 02/14/21 0827 98.3 F (36.8 C)     Temp Source 02/14/21 0827 Oral     SpO2 02/14/21 0827 98 %     Weight --      Height --      Head Circumference --      Peak Flow --      Pain Score 02/14/21 0826 5     Pain Loc --      Pain Edu? --      Excl. in [place]? --   No data found.  Updated Vital Signs BP (!) 142/76 (BP Location: Right Arm)   Pulse 76   Temp 97.6 F (36.4 C) (Oral)   Resp 19   SpO2 96%   Physical Exam Vitals and nursing note reviewed.  Constitutional:      General: She is not in acute distress.    Appearance: Normal appearance. She is not ill-appearing.  HENT:     Head: Normocephalic and atraumatic.     Salivary Glands: Right salivary gland is not diffusely enlarged or tender. Left salivary gland is not diffusely enlarged or tender.     Right Ear: Tympanic membrane, ear canal and external ear normal. No drainage. No middle ear effusion. There is no impacted cerumen. Tympanic membrane is not erythematous or bulging.     Left Ear: Tympanic membrane, ear canal and external ear normal. No drainage.  No middle ear effusion. There is no impacted cerumen. Tympanic membrane is not erythematous or bulging.     Nose: Nose normal. No nasal deformity, septal deviation, mucosal edema, congestion or rhinorrhea.     Right Turbinates: Not enlarged, swollen or pale.     Left  Turbinates: Not enlarged, swollen or pale.     Right Sinus: No maxillary sinus tenderness or frontal sinus tenderness.     Left Sinus: No maxillary sinus tenderness or frontal sinus tenderness.     Mouth/Throat:  Lips: Pink. No lesions.     Mouth: Mucous membranes are moist. No oral lesions.     Pharynx: Oropharynx is clear. Uvula midline. No posterior oropharyngeal erythema or uvula swelling.     Tonsils: No tonsillar exudate. 0 on the right. 0 on the left.  Eyes:     General: Lids are normal.        Right eye: No discharge.        Left eye: No discharge.     Extraocular Movements: Extraocular movements intact.     Conjunctiva/sclera: Conjunctivae normal.     Right eye: Right conjunctiva is not injected.     Left eye: Left conjunctiva is not injected.  Neck:     Trachea: Trachea and phonation normal.  Cardiovascular:     Rate and Rhythm: Normal rate and regular rhythm.     Pulses: Normal pulses.     Heart sounds: Normal heart sounds. No murmur heard.   No friction rub. No gallop.  Pulmonary:     Effort: Pulmonary effort is normal. No accessory muscle usage, prolonged expiration or respiratory distress.     Breath sounds: Normal breath sounds. No stridor, decreased air movement or transmitted upper airway sounds. No decreased breath sounds, wheezing, rhonchi or rales.  Chest:     Chest wall: No tenderness.  Musculoskeletal:        General: Normal range of motion.     Cervical back: Normal range of motion and neck supple. Normal range of motion.  Lymphadenopathy:     Cervical: No cervical adenopathy.  Skin:    General: Skin is warm and dry.     Findings: No erythema or rash.  Neurological:     General: No focal deficit present.     Mental Status: She is alert and oriented to person, place, and time.  Psychiatric:        Mood and Affect: Mood normal.        Behavior: Behavior normal.    Visual Acuity Right Eye Distance:   Left Eye Distance:   Bilateral Distance:     Right Eye Near:   Left Eye Near:    Bilateral Near:     UC Couse / Diagnostics / Procedures:    EKG  Radiology No results found.  Procedures Procedures (including critical care time)  UC Diagnoses / Final Clinical Impressions(s)   I have reviewed the triage vital signs and the nursing notes.  Pertinent labs & imaging results that were available during my care of the patient were reviewed by me and considered in my medical decision making (see chart for details).   Final diagnoses:  Eustachian tube dysfunction, left   Patient advised to begin Atrovent in addition to Flonase, instructions for using these medications provided.  Patient also advised to follow-up with ENT for further evaluation in the next few days if she does not see her symptoms improved.  ED Prescriptions     Medication Sig Dispense Auth. Provider   fluticasone (FLONASE) 50 MCG/ACT nasal spray Place 2 sprays into both nostrils daily. 18 mL Lynden Oxford Scales, PA-C   ipratropium (ATROVENT) 0.06 % nasal spray Place 2 sprays into both nostrils 4 (four) times daily. As needed for nasal congestion, runny nose 15 mL Lynden Oxford Scales, PA-C      PDMP not reviewed this encounter.  Pending results:  Labs Reviewed - No data to display  Medications Ordered in UC: Medications - No data to display  Disposition Upon Discharge:  Condition: stable for discharge home Home: take medications as prescribed; routine discharge instructions as discussed; follow up as advised.  Patient presented with an acute illness with associated systemic symptoms and significant discomfort requiring urgent management. In my opinion, this is a condition that a prudent lay person (someone who possesses an average knowledge of health and medicine) may potentially expect to result in complications if not addressed urgently such as respiratory distress, impairment of bodily function or dysfunction of bodily organs.   Routine symptom  specific, illness specific and/or disease specific instructions were discussed with the patient and/or caregiver at length.   As such, the patient has been evaluated and assessed, work-up was performed and treatment was provided in alignment with urgent care protocols and evidence based medicine.  Patient/parent/caregiver has been advised that the patient may require follow up for further testing and treatment if the symptoms continue in spite of treatment, as clinically indicated and appropriate.  The patient was tested for COVID-19, Influenza and/or RSV, then the patient/parent/guardian was advised to isolate at home pending the results of his/her diagnostic coronavirus test and potentially longer if they're positive. I have also advised pt that if his/her COVID-19 test returns positive, it's recommended to self-isolate for at least 10 days after symptoms first appeared AND until fever-free for 24 hours without fever reducer AND other symptoms have improved or resolved. Discussed self-isolation recommendations as well as instructions for household member/close contacts as per the [facility] and [place] [facility]HS, and also gave patient the [facility] packet with this information.  Patient/parent/caregiver has been advised to return to the [facility] or PCP in 3-5 days if no better; to PCP or the Emergency Department if new signs and symptoms develop, or if the current signs or symptoms continue to change or worsen for further workup, evaluation and treatment as clinically indicated and appropriate  The patient will follow up with their current PCP if and as advised. If the patient does not currently have a PCP we will assist them in obtaining one.   The patient may need specialty follow up if the symptoms continue, in spite of conservative treatment and management, for further workup, evaluation, consultation and treatment as clinically indicated and appropriate.  Patient/parent/caregiver verbalized understanding and agreement of  plan as discussed.  All questions were addressed during visit.  Please see discharge instructions below for further details of plan.  Discharge Instructions:   Discharge Instructions      I believe that the symptoms that you are experiencing your left ear are related to eustachian tube dysfunction which are secondary to some mild allergic rhinitis.  Allergic rhinitis can also cause that postnasal drip and tickling her throat.  To treat this, I agree with your primary care provider that you should be using Flonase daily.  Flonase is a topical steroid which provides long-term relief of mild allergic rhinitis that does not require treatment with oral antihistamines.  Because you have been experiencing nosebleeds after using this medication in the past, I recommend that you only use 1 spray daily in each nostril.  To provide more rapid relief of nasal congestion and sinusitis, I recommend that she begin using Atrovent nasal spray, 2 sprays in each nare up to 4 times daily for congestion, runny nose and fullness in the ear.  As we discussed, your sinuses, your nasal passages, the back of your throat and your eustachian tube are all interrelated and interconnected.  When area is not working, the other areas will complain.  I have  enclosed some general information about eustachian tube dysfunction so you could know a little bit more about what is going on.  Also as we discussed, because you have an upcoming flight and are concerned about difficulty with your trip, I recommend that you make an appointment with an ear nose and throat provider so that you can have an appointment in hand if the plan we discussed is not effective for you.  Thank you for visiting urgent care today, I wish you a happy holiday and a safe trip.        Lynden Oxford Scales, PA-C 03/25/21 1054

## 2021-03-25 NOTE — ED Triage Notes (Signed)
Pt presents with a sore throat and congestion X 1 month.   Pt states she has had ear pain and cough.   Pt states she was given 10 days of Amoxicillin and states she was treated for pink eye as well.   States she has some congestion, sore throat and c/o a possible ear infection.

## 2021-04-02 DIAGNOSIS — I7090 Unspecified atherosclerosis: Secondary | ICD-10-CM | POA: Diagnosis not present

## 2021-04-02 DIAGNOSIS — E039 Hypothyroidism, unspecified: Secondary | ICD-10-CM | POA: Diagnosis not present

## 2021-04-02 DIAGNOSIS — R7989 Other specified abnormal findings of blood chemistry: Secondary | ICD-10-CM | POA: Diagnosis not present

## 2021-04-02 DIAGNOSIS — E559 Vitamin D deficiency, unspecified: Secondary | ICD-10-CM | POA: Diagnosis not present

## 2021-04-02 DIAGNOSIS — E782 Mixed hyperlipidemia: Secondary | ICD-10-CM | POA: Diagnosis not present

## 2021-04-28 DIAGNOSIS — Z1231 Encounter for screening mammogram for malignant neoplasm of breast: Secondary | ICD-10-CM | POA: Diagnosis not present

## 2021-05-09 DIAGNOSIS — N6001 Solitary cyst of right breast: Secondary | ICD-10-CM | POA: Diagnosis not present

## 2021-05-09 DIAGNOSIS — R928 Other abnormal and inconclusive findings on diagnostic imaging of breast: Secondary | ICD-10-CM | POA: Diagnosis not present

## 2021-05-23 DIAGNOSIS — L821 Other seborrheic keratosis: Secondary | ICD-10-CM | POA: Diagnosis not present

## 2021-05-23 DIAGNOSIS — L720 Epidermal cyst: Secondary | ICD-10-CM | POA: Diagnosis not present

## 2021-05-23 DIAGNOSIS — B351 Tinea unguium: Secondary | ICD-10-CM | POA: Diagnosis not present

## 2021-05-30 DIAGNOSIS — L720 Epidermal cyst: Secondary | ICD-10-CM | POA: Diagnosis not present

## 2021-06-12 DIAGNOSIS — H903 Sensorineural hearing loss, bilateral: Secondary | ICD-10-CM | POA: Diagnosis not present

## 2021-06-12 DIAGNOSIS — H6983 Other specified disorders of Eustachian tube, bilateral: Secondary | ICD-10-CM | POA: Diagnosis not present

## 2021-06-12 DIAGNOSIS — E039 Hypothyroidism, unspecified: Secondary | ICD-10-CM | POA: Diagnosis not present

## 2021-07-15 DIAGNOSIS — E039 Hypothyroidism, unspecified: Secondary | ICD-10-CM | POA: Diagnosis not present

## 2021-08-18 ENCOUNTER — Other Ambulatory Visit: Payer: Self-pay | Admitting: General Surgery

## 2021-08-18 ENCOUNTER — Encounter: Payer: Self-pay | Admitting: Physician Assistant

## 2021-08-18 DIAGNOSIS — R102 Pelvic and perineal pain: Secondary | ICD-10-CM

## 2021-09-01 ENCOUNTER — Ambulatory Visit: Payer: Medicare Other | Admitting: Physician Assistant

## 2021-09-03 DIAGNOSIS — E039 Hypothyroidism, unspecified: Secondary | ICD-10-CM | POA: Diagnosis not present

## 2021-09-03 DIAGNOSIS — E559 Vitamin D deficiency, unspecified: Secondary | ICD-10-CM | POA: Diagnosis not present

## 2021-09-03 DIAGNOSIS — E063 Autoimmune thyroiditis: Secondary | ICD-10-CM | POA: Diagnosis not present

## 2021-09-03 DIAGNOSIS — E782 Mixed hyperlipidemia: Secondary | ICD-10-CM | POA: Diagnosis not present

## 2021-09-10 DIAGNOSIS — E039 Hypothyroidism, unspecified: Secondary | ICD-10-CM | POA: Diagnosis not present

## 2021-10-08 DIAGNOSIS — E039 Hypothyroidism, unspecified: Secondary | ICD-10-CM | POA: Diagnosis not present

## 2022-01-06 DIAGNOSIS — I7090 Unspecified atherosclerosis: Secondary | ICD-10-CM | POA: Diagnosis not present

## 2022-01-06 DIAGNOSIS — E039 Hypothyroidism, unspecified: Secondary | ICD-10-CM | POA: Diagnosis not present

## 2022-01-06 DIAGNOSIS — E559 Vitamin D deficiency, unspecified: Secondary | ICD-10-CM | POA: Diagnosis not present

## 2022-01-06 DIAGNOSIS — R7989 Other specified abnormal findings of blood chemistry: Secondary | ICD-10-CM | POA: Diagnosis not present

## 2022-01-06 DIAGNOSIS — E782 Mixed hyperlipidemia: Secondary | ICD-10-CM | POA: Diagnosis not present

## 2022-01-14 DIAGNOSIS — E039 Hypothyroidism, unspecified: Secondary | ICD-10-CM | POA: Diagnosis not present

## 2022-03-09 DIAGNOSIS — E782 Mixed hyperlipidemia: Secondary | ICD-10-CM | POA: Diagnosis not present

## 2022-03-09 DIAGNOSIS — E039 Hypothyroidism, unspecified: Secondary | ICD-10-CM | POA: Diagnosis not present

## 2022-03-09 DIAGNOSIS — Z0000 Encounter for general adult medical examination without abnormal findings: Secondary | ICD-10-CM | POA: Diagnosis not present

## 2022-03-09 DIAGNOSIS — I10 Essential (primary) hypertension: Secondary | ICD-10-CM | POA: Diagnosis not present

## 2022-03-09 DIAGNOSIS — Z8551 Personal history of malignant neoplasm of bladder: Secondary | ICD-10-CM | POA: Diagnosis not present

## 2022-04-29 DIAGNOSIS — Z1231 Encounter for screening mammogram for malignant neoplasm of breast: Secondary | ICD-10-CM | POA: Diagnosis not present

## 2022-05-13 DIAGNOSIS — R7989 Other specified abnormal findings of blood chemistry: Secondary | ICD-10-CM | POA: Diagnosis not present

## 2022-05-13 DIAGNOSIS — I7090 Unspecified atherosclerosis: Secondary | ICD-10-CM | POA: Diagnosis not present

## 2022-05-13 DIAGNOSIS — E559 Vitamin D deficiency, unspecified: Secondary | ICD-10-CM | POA: Diagnosis not present

## 2022-05-13 DIAGNOSIS — E782 Mixed hyperlipidemia: Secondary | ICD-10-CM | POA: Diagnosis not present

## 2022-05-13 DIAGNOSIS — E039 Hypothyroidism, unspecified: Secondary | ICD-10-CM | POA: Diagnosis not present

## 2022-06-03 DIAGNOSIS — Z9889 Other specified postprocedural states: Secondary | ICD-10-CM | POA: Diagnosis not present

## 2022-06-03 DIAGNOSIS — H04123 Dry eye syndrome of bilateral lacrimal glands: Secondary | ICD-10-CM | POA: Diagnosis not present

## 2022-06-03 DIAGNOSIS — Z961 Presence of intraocular lens: Secondary | ICD-10-CM | POA: Diagnosis not present

## 2022-06-05 DIAGNOSIS — R350 Frequency of micturition: Secondary | ICD-10-CM | POA: Diagnosis not present

## 2022-06-05 DIAGNOSIS — Z8551 Personal history of malignant neoplasm of bladder: Secondary | ICD-10-CM | POA: Diagnosis not present

## 2022-06-10 DIAGNOSIS — L814 Other melanin hyperpigmentation: Secondary | ICD-10-CM | POA: Diagnosis not present

## 2022-06-10 DIAGNOSIS — Z85828 Personal history of other malignant neoplasm of skin: Secondary | ICD-10-CM | POA: Diagnosis not present

## 2022-06-10 DIAGNOSIS — L821 Other seborrheic keratosis: Secondary | ICD-10-CM | POA: Diagnosis not present

## 2022-06-10 DIAGNOSIS — D225 Melanocytic nevi of trunk: Secondary | ICD-10-CM | POA: Diagnosis not present

## 2022-06-10 DIAGNOSIS — C44712 Basal cell carcinoma of skin of right lower limb, including hip: Secondary | ICD-10-CM | POA: Diagnosis not present

## 2022-06-10 DIAGNOSIS — L820 Inflamed seborrheic keratosis: Secondary | ICD-10-CM | POA: Diagnosis not present

## 2022-07-09 DIAGNOSIS — M180 Bilateral primary osteoarthritis of first carpometacarpal joints: Secondary | ICD-10-CM | POA: Diagnosis not present

## 2022-08-04 DIAGNOSIS — M6289 Other specified disorders of muscle: Secondary | ICD-10-CM | POA: Diagnosis not present

## 2022-08-04 DIAGNOSIS — M6281 Muscle weakness (generalized): Secondary | ICD-10-CM | POA: Diagnosis not present

## 2022-09-04 DIAGNOSIS — C672 Malignant neoplasm of lateral wall of bladder: Secondary | ICD-10-CM | POA: Diagnosis not present

## 2022-09-08 ENCOUNTER — Other Ambulatory Visit: Payer: Self-pay | Admitting: Urology

## 2022-09-15 NOTE — Patient Instructions (Signed)
SURGICAL WAITING ROOM VISITATION  Patients having surgery or a procedure may have no more than 2 support people in the waiting area - these visitors may rotate.    Children under the age of 16 must have an adult with them who is not the patient.  Due to an increase in RSV and influenza rates and associated hospitalizations, children ages 12 and under may not visit patients in [facility] hospitals.  If the patient needs to stay at the hospital during part of their recovery, the visitor guidelines for inpatient rooms apply. Pre-op nurse will coordinate an appropriate time for 1 support person to accompany patient in pre-op.  This support person may not rotate.    Please refer to the [facility] website for the visitor guidelines for Inpatients (after your surgery is over and you are in a regular room).    Your procedure is scheduled on: 06/29/22   Report to [facility] Main Entrance    Report to admitting at 9:45 AM   Call this number if you have problems the morning of surgery [phone]   Do not eat food or drink liquids :After Midnight.          If you have questions, please contact your surgeon's office.   FOLLOW BOWEL PREP AND ANY ADDITIONAL PRE OP INSTRUCTIONS YOU RECEIVED FROM YOUR SURGEON'S OFFICE!!!     Oral Hygiene is also important to reduce your risk of infection.                                    Remember - BRUSH YOUR TEETH THE MORNING OF SURGERY WITH YOUR REGULAR TOOTHPASTE  DENTURES WILL BE REMOVED PRIOR TO SURGERY PLEASE DO NOT APPLY "Poly grip" OR ADHESIVES!!!   Take these medicines the morning of surgery with A SIP OF WATER: Tylenol, Zofran, Oxycodone, Venlafaxine             You may not have any metal on your body including hair pins, jewelry, and body piercing             Do not wear make-up, lotions, powders, perfumes/cologne, or deodorant  Do not shave  48 hours prior to surgery.               Men may shave face and neck.   Do not bring  valuables to the hospital. [facility] IS NOT             RESPONSIBLE   FOR VALUABLES.   Contacts, glasses, dentures or bridgework may not be worn into surgery.  DO NOT BRING YOUR HOME MEDICATIONS TO THE HOSPITAL. PHARMACY WILL DISPENSE MEDICATIONS LISTED ON YOUR MEDICATION LIST TO YOU DURING YOUR ADMISSION IN THE HOSPITAL!    Patients discharged on the day of surgery will not be allowed to drive home.  Someone NEEDS to stay with you for the first 24 hours after anesthesia.              Please read over the following fact sheets you were given: IF YOU HAVE QUESTIONS ABOUT YOUR PRE-OP INSTRUCTIONS PLEASE CALL [phone]- Taysia Rivere    If you received a COVID test during your pre-op visit  it is requested that you wear a mask when out in public, stay away from anyone that may not be feeling well and notify your surgeon if you develop symptoms. If you test positive for Covid or have been in   contact with anyone that has tested positive in the last 10 days please notify you surgeon.    [facility] - Preparing for Surgery Before surgery, you can play an important role.  Because skin is not sterile, your skin needs to be as free of germs as possible.  You can reduce the number of germs on your skin by washing with CHG (chlorahexidine gluconate) soap before surgery.  CHG is an antiseptic cleaner which kills germs and bonds with the skin to continue killing germs even after washing. Please DO NOT use if you have an allergy to CHG or antibacterial soaps.  If your skin becomes reddened/irritated stop using the CHG and inform your nurse when you arrive at Short Stay. Do not shave (including legs and underarms) for at least 48 hours prior to the first CHG shower.  You may shave your face/neck.  Please follow these instructions carefully:  1.  Shower with CHG Soap the night before surgery and the  morning of surgery.  2.  If you choose to wash your hair, wash your hair first as usual with your normal   shampoo.  3.  After you shampoo, rinse your hair and body thoroughly to remove the shampoo.                             4.  Use CHG as you would any other liquid soap.  You can apply chg directly to the skin and wash.  Gently with a scrungie or clean washcloth.  5.  Apply the CHG Soap to your body ONLY FROM THE NECK DOWN.   Do   not use on face/ open                           Wound or open sores. Avoid contact with eyes, ears mouth and   genitals (private parts).                       Wash face,  Genitals (private parts) with your normal soap.             6.  Wash thoroughly, paying special attention to the area where your    surgery  will be performed.  7.  Thoroughly rinse your body with warm water from the neck down.  8.  DO NOT shower/wash with your normal soap after using and rinsing off the CHG Soap.                9.  Pat yourself dry with a clean towel.            10.  Wear clean pajamas.            11.  Place clean sheets on your bed the night of your first shower and do not  sleep with pets. Day of Surgery : Do not apply any lotions/deodorants the morning of surgery.  Please wear clean clothes to the hospital/surgery center.  FAILURE TO FOLLOW THESE INSTRUCTIONS MAY RESULT IN THE CANCELLATION OF YOUR SURGERY  PATIENT SIGNATURE_________________________________  NURSE SIGNATURE__________________________________  ________________________________________________________________________ 

## 2022-09-15 NOTE — Progress Notes (Signed)
Patient had lab work done this AM with Becky Augusta. Will request lab results.  COVID Vaccine Completed: yes  Date of COVID positive in last 90 days: no  PCP - Ewing Schlein, MD Cardiologist - n/a  Chest x-ray - n/a EKG - 09/16/22 Epic/chart Stress Test - many years ago per for palpitations ECHO - n/a Cardiac Cath - n/a Pacemaker/ICD device last checked: n/a Spinal Cord Stimulator: n/a  Bowel Prep - no  Sleep Study - n/a CPAP -   Fasting Blood Sugar - n/a Checks Blood Sugar _____ times a day  Last dose of GLP1 agonist-  N/A GLP1 instructions:  N/A   Last dose of SGLT-2 inhibitors-  N/A SGLT-2 instructions: N/A   Blood Thinner Instructions:  n/a Aspirin Instructions: Last Dose:  Activity level: Can go up a flight of stairs and perform activities of daily living without stopping and without symptoms of chest pain or shortness of breath.    Anesthesia review:   Patient denies shortness of breath, fever, cough and chest pain at PAT appointment  Patient verbalized understanding of instructions that were given to them at the PAT appointment. Patient was also instructed that they will need to review over the PAT instructions again at home before surgery.

## 2022-09-16 ENCOUNTER — Encounter (HOSPITAL_COMMUNITY): Payer: Self-pay

## 2022-09-16 ENCOUNTER — Encounter (HOSPITAL_COMMUNITY)
Admission: RE | Admit: 2022-09-16 | Discharge: 2022-09-16 | Disposition: A | Discharge status: Home / Self Care | Payer: Medicare Other | Source: Ambulatory Visit | Attending: Urology | Admitting: Urology

## 2022-09-16 ENCOUNTER — Other Ambulatory Visit: Payer: Self-pay

## 2022-09-16 VITALS — BP 151/78 | HR 62 | Resp 14 | Ht 66.0 in

## 2022-09-16 DIAGNOSIS — R7989 Other specified abnormal findings of blood chemistry: Secondary | ICD-10-CM | POA: Diagnosis not present

## 2022-09-16 DIAGNOSIS — R001 Bradycardia, unspecified: Secondary | ICD-10-CM | POA: Diagnosis not present

## 2022-09-16 DIAGNOSIS — I7090 Unspecified atherosclerosis: Secondary | ICD-10-CM | POA: Diagnosis not present

## 2022-09-16 DIAGNOSIS — R5383 Other fatigue: Secondary | ICD-10-CM | POA: Diagnosis not present

## 2022-09-16 DIAGNOSIS — I10 Essential (primary) hypertension: Secondary | ICD-10-CM | POA: Diagnosis not present

## 2022-09-16 DIAGNOSIS — Z01818 Encounter for other preprocedural examination: Secondary | ICD-10-CM | POA: Diagnosis not present

## 2022-09-16 DIAGNOSIS — E039 Hypothyroidism, unspecified: Secondary | ICD-10-CM | POA: Diagnosis not present

## 2022-09-16 DIAGNOSIS — E782 Mixed hyperlipidemia: Secondary | ICD-10-CM | POA: Diagnosis not present

## 2022-09-16 HISTORY — DX: Headache, unspecified: R51.9

## 2022-09-16 HISTORY — DX: Anemia, unspecified: D64.9

## 2022-09-22 ENCOUNTER — Encounter (HOSPITAL_COMMUNITY)
Admission: RE | Disposition: A | Discharge status: Home / Self Care | Payer: Self-pay | Source: Ambulatory Visit | Attending: Urology

## 2022-09-22 ENCOUNTER — Ambulatory Visit (HOSPITAL_COMMUNITY)
Admission: RE | Admit: 2022-09-22 | Discharge: 2022-09-22 | Disposition: A | Discharge status: Home / Self Care | Payer: Medicare Other | Source: Ambulatory Visit | Attending: Urology | Admitting: Urology

## 2022-09-22 ENCOUNTER — Ambulatory Visit (HOSPITAL_COMMUNITY): Payer: Medicare Other | Admitting: Anesthesiology

## 2022-09-22 ENCOUNTER — Ambulatory Visit (HOSPITAL_BASED_OUTPATIENT_CLINIC_OR_DEPARTMENT_OTHER): Payer: Medicare Other | Admitting: Anesthesiology

## 2022-09-22 ENCOUNTER — Encounter (HOSPITAL_COMMUNITY): Payer: Self-pay | Admitting: Urology

## 2022-09-22 DIAGNOSIS — N3946 Mixed incontinence: Secondary | ICD-10-CM | POA: Diagnosis not present

## 2022-09-22 DIAGNOSIS — D303 Benign neoplasm of bladder: Secondary | ICD-10-CM

## 2022-09-22 DIAGNOSIS — D638 Anemia in other chronic diseases classified elsewhere: Secondary | ICD-10-CM

## 2022-09-22 DIAGNOSIS — C679 Malignant neoplasm of bladder, unspecified: Secondary | ICD-10-CM | POA: Diagnosis not present

## 2022-09-22 DIAGNOSIS — Z8551 Personal history of malignant neoplasm of bladder: Secondary | ICD-10-CM | POA: Insufficient documentation

## 2022-09-22 DIAGNOSIS — F418 Other specified anxiety disorders: Secondary | ICD-10-CM | POA: Insufficient documentation

## 2022-09-22 DIAGNOSIS — M1990 Unspecified osteoarthritis, unspecified site: Secondary | ICD-10-CM | POA: Insufficient documentation

## 2022-09-22 DIAGNOSIS — E063 Autoimmune thyroiditis: Secondary | ICD-10-CM | POA: Insufficient documentation

## 2022-09-22 DIAGNOSIS — I10 Essential (primary) hypertension: Secondary | ICD-10-CM | POA: Insufficient documentation

## 2022-09-22 DIAGNOSIS — C673 Malignant neoplasm of anterior wall of bladder: Secondary | ICD-10-CM | POA: Diagnosis not present

## 2022-09-22 DIAGNOSIS — E039 Hypothyroidism, unspecified: Secondary | ICD-10-CM

## 2022-09-22 DIAGNOSIS — D414 Neoplasm of uncertain behavior of bladder: Secondary | ICD-10-CM | POA: Diagnosis present

## 2022-09-22 DIAGNOSIS — D630 Anemia in neoplastic disease: Secondary | ICD-10-CM | POA: Diagnosis not present

## 2022-09-22 DIAGNOSIS — D494 Neoplasm of unspecified behavior of bladder: Secondary | ICD-10-CM | POA: Diagnosis not present

## 2022-09-22 LAB — BASIC METABOLIC PANEL
Anion gap: 9 (ref 5–15)
BUN: 16 mg/dL (ref 8–23)
CO2: 21 mmol/L — ABNORMAL LOW (ref 22–32)
Calcium: 9 mg/dL (ref 8.9–10.3)
Chloride: 108 mmol/L (ref 98–111)
Creatinine, Ser: 0.66 mg/dL (ref 0.44–1.00)
GFR, Estimated: >60 mL/min (ref >60)
Glucose, Bld: 96 mg/dL (ref 70–99)
Potassium: 3.9 mmol/L (ref 3.5–5.1)
Sodium: 138 mmol/L (ref 135–145)

## 2022-09-22 LAB — CBC
HCT: 42.8 % (ref 36.0–46.0)
Hemoglobin: 14.7 g/dL (ref 12.0–15.0)
MCH: 29.9 pg (ref 26.0–34.0)
MCHC: 34.3 g/dL (ref 30.0–36.0)
MCV: 87 fL (ref 80.0–100.0)
Platelets: 216 10*3/uL (ref 150–400)
RBC: 4.92 MIL/uL (ref 3.87–5.11)
RDW: 12.9 % (ref 11.5–15.5)
WBC: 5.9 10*3/uL (ref 4.0–10.5)
nRBC: 0 % (ref 0.0–0.2)

## 2022-09-22 SURGERY — TURBT, WITH CHEMOTHERAPEUTIC AGENT INSTILLATION INTO BLADDER
Anesthesia: General

## 2022-09-22 MED ORDER — SUGAMMADEX SODIUM 200 MG/2ML IV SOLN
INTRAVENOUS | Status: DC | PRN
  Administered 2022-09-22: 200 mg via INTRAVENOUS

## 2022-09-22 MED ORDER — PROPOFOL 10 MG/ML IV BOLUS
INTRAVENOUS | Status: AC
  Filled 2022-09-22: qty 20

## 2022-09-22 MED ORDER — PHENYLEPHRINE HCL (PRESSORS) 10 MG/ML IV SOLN
INTRAVENOUS | Status: AC
  Filled 2022-09-22: qty 1

## 2022-09-22 MED ORDER — PROPOFOL 500 MG/50ML IV EMUL
INTRAVENOUS | Status: DC | PRN
  Administered 2022-09-22: 125 ug/kg/min via INTRAVENOUS

## 2022-09-22 MED ORDER — 0.9 % SODIUM CHLORIDE (POUR BTL) OPTIME
TOPICAL | Status: DC | PRN
  Administered 2022-09-22: 1000 mL

## 2022-09-22 MED ORDER — LIDOCAINE 2% (20 MG/ML) 5 ML SYRINGE
INTRAMUSCULAR | Status: DC | PRN
  Administered 2022-09-22: 60 mg via INTRAVENOUS

## 2022-09-22 MED ORDER — GEMCITABINE CHEMO FOR BLADDER INSTILLATION 2000 MG
2000.0000 mg | Freq: Once | INTRAVENOUS | Status: AC
  Administered 2022-09-22: 2000 mg via INTRAVESICAL
  Filled 2022-09-22: qty 2000

## 2022-09-22 MED ORDER — DEXAMETHASONE SODIUM PHOSPHATE 10 MG/ML IJ SOLN
INTRAMUSCULAR | Status: AC
  Filled 2022-09-22: qty 1

## 2022-09-22 MED ORDER — LACTATED RINGERS IV SOLN: INTRAVENOUS | Status: DC

## 2022-09-22 MED ORDER — SODIUM CHLORIDE 0.9 % IR SOLN
Status: DC | PRN
  Administered 2022-09-22: 3000 mL

## 2022-09-22 MED ORDER — ONDANSETRON HCL 4 MG/2ML IJ SOLN
INTRAMUSCULAR | Status: DC | PRN
  Administered 2022-09-22: 4 mg via INTRAVENOUS

## 2022-09-22 MED ORDER — ROCURONIUM BROMIDE 100 MG/10ML IV SOLN
INTRAVENOUS | Status: DC | PRN
  Administered 2022-09-22: 50 mg via INTRAVENOUS

## 2022-09-22 MED ORDER — FENTANYL CITRATE (PF) 100 MCG/2ML IJ SOLN
INTRAMUSCULAR | Status: DC | PRN
  Administered 2022-09-22: 100 ug via INTRAVENOUS

## 2022-09-22 MED ORDER — TRAMADOL HCL 50 MG PO TABS: 50.0000 mg | ORAL_TABLET | Freq: Four times a day (QID) | ORAL | 0 refills | Status: AC | PRN

## 2022-09-22 MED ORDER — CHLORHEXIDINE GLUCONATE 0.12 % MT SOLN
15.0000 mL | Freq: Once | OROMUCOSAL | Status: AC
  Administered 2022-09-22: 15 mL via OROMUCOSAL

## 2022-09-22 MED ORDER — PROPOFOL 1000 MG/100ML IV EMUL
INTRAVENOUS | Status: AC
  Filled 2022-09-22: qty 100

## 2022-09-22 MED ORDER — FENTANYL CITRATE (PF) 100 MCG/2ML IJ SOLN
INTRAMUSCULAR | Status: AC
  Filled 2022-09-22: qty 2

## 2022-09-22 MED ORDER — ACETAMINOPHEN 500 MG PO TABS
1000.0000 mg | ORAL_TABLET | Freq: Once | ORAL | Status: AC
  Administered 2022-09-22: 1000 mg via ORAL
  Filled 2022-09-22: qty 2

## 2022-09-22 MED ORDER — ORAL CARE MOUTH RINSE: 15.0000 mL | Freq: Once | OROMUCOSAL | Status: AC

## 2022-09-22 MED ORDER — DEXAMETHASONE SODIUM PHOSPHATE 10 MG/ML IJ SOLN
INTRAMUSCULAR | Status: DC | PRN
  Administered 2022-09-22: 6 mg via INTRAVENOUS

## 2022-09-22 MED ORDER — PROPOFOL 10 MG/ML IV BOLUS
INTRAVENOUS | Status: DC | PRN
  Administered 2022-09-22: 120 mg via INTRAVENOUS

## 2022-09-22 MED ORDER — ONDANSETRON HCL 4 MG/2ML IJ SOLN
INTRAMUSCULAR | Status: AC
  Filled 2022-09-22: qty 2

## 2022-09-22 MED ORDER — FENTANYL CITRATE PF 50 MCG/ML IJ SOSY: 25.0000 ug | PREFILLED_SYRINGE | INTRAMUSCULAR | Status: DC | PRN

## 2022-09-22 MED ORDER — ROCURONIUM BROMIDE 10 MG/ML (PF) SYRINGE
PREFILLED_SYRINGE | INTRAVENOUS | Status: AC
  Filled 2022-09-22: qty 10

## 2022-09-22 MED ORDER — CEFAZOLIN SODIUM-DEXTROSE 2-4 GM/100ML-% IV SOLN
2.0000 g | INTRAVENOUS | Status: AC
  Administered 2022-09-22: 2 g via INTRAVENOUS
  Filled 2022-09-22: qty 100

## 2022-09-22 MED ORDER — LIDOCAINE HCL (PF) 2 % IJ SOLN
INTRAMUSCULAR | Status: AC
  Filled 2022-09-22: qty 5

## 2022-09-22 SURGICAL SUPPLY — 18 items
BAG DRN RND TRDRP ANRFLXCHMBR (UROLOGICAL SUPPLIES) ×1
BAG URINE DRAIN 2000ML AR STRL (UROLOGICAL SUPPLIES) IMPLANT
BAG URO CATCHER STRL LF (MISCELLANEOUS) ×1 IMPLANT
CATH FOLEY 2WAY SLVR 5CC 18FR (CATHETERS) IMPLANT
CLOTH BEACON ORANGE TIMEOUT ST (SAFETY) ×1 IMPLANT
DRAPE FOOT SWITCH (DRAPES) ×1 IMPLANT
ELECT REM PT RETURN 15FT ADLT (MISCELLANEOUS) IMPLANT
GLOVE BIO SURGEON STRL SZ 6.5 (GLOVE) ×1 IMPLANT
GOWN STRL REUS W/ TWL LRG LVL3 (GOWN DISPOSABLE) ×1 IMPLANT
GOWN STRL REUS W/TWL LRG LVL3 (GOWN DISPOSABLE) ×1
KIT TURNOVER KIT A (KITS) IMPLANT
LOOP CUT BIPOLAR 24F LRG (ELECTROSURGICAL) IMPLANT
MANIFOLD NEPTUNE II (INSTRUMENTS) ×1 IMPLANT
PACK CYSTO (CUSTOM PROCEDURE TRAY) ×1 IMPLANT
SYR TOOMEY IRRIG 70ML (MISCELLANEOUS)
SYRINGE TOOMEY IRRIG 70ML (MISCELLANEOUS) IMPLANT
TUBING CONNECTING 10 (TUBING) ×1 IMPLANT
TUBING UROLOGY SET (TUBING) ×1 IMPLANT

## 2022-09-22 NOTE — Anesthesia Postprocedure Evaluation (Signed)
Anesthesia Post Note  Patient: Sheri Mueller  Procedure(s) Performed: TRANSURETHRAL RESECTION OF BLADDER TUMOR (TURBT) with GEMCITABINE     Patient location during evaluation: PACU Anesthesia Type: General Level of consciousness: awake and alert Pain management: pain level controlled Vital Signs Assessment: post-procedure vital signs reviewed and stable Respiratory status: spontaneous breathing, nonlabored ventilation and respiratory function stable Cardiovascular status: blood pressure returned to baseline and stable Postop Assessment: no apparent nausea or vomiting Anesthetic complications: no  No notable events documented.  Last Vitals:  Vitals:   09/22/22 1145 09/22/22 1200  BP: 127/76 (!) 169/71  Pulse: (!) 59 (!) 58  Resp: 18 14  Temp:    SpO2: 98% 100%    Last Pain:  Vitals:   09/22/22 1200  TempSrc:   PainSc: 0-No pain                 Kendrea Cerritos,W. EDMOND

## 2022-09-22 NOTE — Anesthesia Procedure Notes (Signed)
Procedure Name: Intubation Date/Time: 09/22/2022 10:34 AM  Performed by: Florene Route, CRNAPre-anesthesia Checklist: Patient identified, Emergency Drugs available, Suction available and Patient being monitored Patient Re-evaluated:Patient Re-evaluated prior to induction Oxygen Delivery Method: Circle system utilized Preoxygenation: Pre-oxygenation with 100% oxygen Induction Type: IV induction Ventilation: Mask ventilation without difficulty Laryngoscope Size: Miller and 2 Grade View: Grade II Tube type: Oral Tube size: 7.0 mm Number of attempts: 2 Airway Equipment and Method: Stylet and Oral airway Placement Confirmation: ETT inserted through vocal cords under direct vision, positive ETCO2 and breath sounds checked- equal and bilateral Secured at: 20 cm Tube secured with: Tape Dental Injury: Teeth and Oropharynx as per pre-operative assessment  Comments: First attempt by EMT/Paramedic student, Ian Malkin.  Unsuccessful

## 2022-09-22 NOTE — Op Note (Signed)
PATIENT:  Sheri Mueller  PRE-OPERATIVE DIAGNOSIS: Bladder tumor  POST-OPERATIVE DIAGNOSIS: Same  PROCEDURE:  Procedure(s): 1. TRANSURETHRAL RESECTION OF BLADDER TUMOR (TURBT) (0.5 cm.) 2. Instillation of intravesical chemotherapy (Gemcitabine)  SURGEON: Kasandra Knudsen, MD  ANESTHESIA:   General  EBL:  Minimal  DRAINS: Urethral catheter (18 Fr. Foley)   SPECIMEN:  Bladder tumor  DISPOSITION OF SPECIMEN:  PATHOLOGY  Findings: Normal urethra Bilateral orthotopic Uos 3 small early papillary tumors on left anterior/bladder neck 0.5 cm  Indication:  72 yo woman with history of bladder cancer found to have recurrence on cystoscopy.  Description of operation: The patient was taken to the operating room and administered general anesthesia. They were then placed on the table and moved to the dorsal lithotomy position after which the genitalia was sterilely prepped and draped. An official timeout was then performed.  The 8 French resectoscope with the 30 lens and visual obturator were then passed into the bladder under direct visualization. Urethra appeared normal. The visual obturator was then removed and the Gyrus resectoscope element with 30  lens was then inserted and the bladder was fully and systematically inspected. Ureteral orifices were noted to be in the normal anatomic positions.   The previously seen bladder tumors were seen on the left anterior bladder wall near the bladder neck.  The bipolar loop was then used to resect the area of concern.  Reinspection of the bladder revealed all obvious tumor had been fully resected and there was no evidence of perforation. The Microvasive evacuator was then used to irrigate the bladder and remove all of the portions of bladder tumor which were sent to pathology. I then removed the resectoscope.  A 18 French Foley catheter was then inserted in the bladder and irrigated. The irrigant returned slightly pink with no clots. The patient was  awakened and taken to the recovery room.  While in the recovery room 2 g of gemcitabine in 52.6 cc of sterile water was instilled in the bladder through the catheter and the catheter was plugged. This will remain indwelling for approximately one hour. It will then be drained from the bladder and the catheter will be removed and the patient discharged home.  PLAN OF CARE: Discharge to home after PACU  PATIENT DISPOSITION:  PACU - hemodynamically stable.

## 2022-09-22 NOTE — H&P (Signed)
CC/HPI: cc: bladder cancer, mixed UI   Bladder cancer diagnosed 2007  Recurrence 2019 - PNLMP (TURBT and mitomycin)   06/05/22: 72 year old woman with a history of bladder cancer initially diagnosed in 2007 with last TURBT in 2019 here to reestablish care. Tumor Pathology: PUNLMP. She had treatment with the following intravesical agents: mitomycin. Her last cysto was approximately 10/18/2017. His cystoscopy demonstrated small recurrence at left trigone.  Her last radiologic test to evaluate the kidneys was approximately 11/01/2017. Imaging results: b/l RPG - normal. No gross hematuria or UTIs. She has had stress urinary continence for many years however in the last year she has had worsening stress incontinence as well as incontinence without sensation. She has a history of Hashimoto's disease and sees an alternative medicine physician who put her on particulates for her urinary symptoms. She drinks 2 cups of coffee in the morning followed by protein shake and water throughout the day.   Overactive bladder validate question screener: 3, 0, 1, 2, 1, 0, 0=8/40    09/04/2022: 72 year old woman with a history of mixed urinary continence of bladder cancer here for surveillance cystoscopy. Pelvic floor physical therapy has really been helping her. She has been able to sneeze without leaking.     ALLERGIES: Lisinopril    MEDICATIONS: Escitalopram Oxalate 10 mg tablet  Estradiol 1 mg tablet  Losartan Potassium 100 MG Oral Tablet Oral  Vitamin B Complex TABS Oral  Vitamin C  Vitamin D2  Zinc     Notes: testosterone 0.75 mg- compounded in estradiol/progesterone   GU PSH: Bladder Instill AntiCA Agent - 2019 Cystoscopy - 2019, 2018 Cystoscopy TURBT 2-5 cm - 2019, 2012 Hysterectomy Unilat SO - 2012 Ovary Removal Partial or Total - 2012       PSH Notes: Cystoscopy With Fulguration Medium Lesion (2-5cm), Hand Surgery, Cystoscopy Bladder Tumor, Hysterectomy, Corneal LASIK, Appendectomy, Oophorectomy    NON-GU PSH: Appendectomy - 2012     GU PMH: Mixed incontinence - 08/04/2022, - 06/05/2022 History of bladder cancer - 06/05/2022 Urinary Frequency - 06/05/2022 Bladder Cancer Posterior - 2019 Bladder Cancer Lateral, The patient has a history of low-grade non-muscle invasive bladder cancer. She has been NED since her diagnosis in 2012. Her cystoscopy today was normal. 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 - 2018 Renal calculus, The patient has nonobstructing kidney stones that were seen on a CT scan in 2015. She is asymptomatic. Our plan is to treat these conservatively. - 2018 Bladder Cancer, Unspec, Bladder cancer - 2015 Bladder, Neoplasm of Unspecified behavior, Neoplasm Of The Bladder - 2014 Hematuria, Unspec, Hematuria - 2014 History of urolithiasis, Nephrolithiasis - 2014 Urinary Tract Inf, Unspec site, Urinary tract infection - 2014      PMH Notes:  2010-10-09 13:25:33 - Note: Arthritis   NON-GU PMH: Muscle weakness (generalized) - 08/04/2022 Other specified disorders of muscle - 08/04/2022 Encounter for general adult medical examination without abnormal findings, Encounter for preventive health examination - 2017 Anxiety, Anxiety (Symptom) - 2014 Personal history of other diseases of the circulatory system, History of hypertension - 2014    FAMILY HISTORY: Atrial Fibrillation - Mother Family Health Status - Father alive at age 17 - Runs In Family Family Health Status - Mother's Age - Runs In Family Family Health Status Number - Runs In Family Hematuria - Mother Hypertension - Mother, Father, Brother nephrolithiasis - Runs In Family osteopenia -  Runs In Family, Mother Renal Cell Carcinoma - Runs In Family Transient Ischemic Attack - Runs In Family, Mother   SOCIAL HISTORY: Marital Status: Divorced Preferred Language: English; Ethnicity: Not Hispanic  Or Latino; Race: White Current Smoking Status: Patient has never smoked.   Tobacco Use Assessment Completed: Used Tobacco in last 30 days? Social Drinker.  Drinks 2 caffeinated drinks per day.     Notes: Caffeine Use, Never A Smoker, Occupation:, Alcohol Use, Marital History - Divorced   REVIEW OF SYSTEMS:    GU Review Female:   Patient denies trouble starting your stream, leakage of urine, have to strain to urinate, get up at night to urinate, frequent urination, being pregnant, hard to postpone urination, stream starts and stops, and burning /pain with urination.  Gastrointestinal (Upper):   Patient denies nausea, vomiting, and indigestion/ heartburn.  Gastrointestinal (Lower):   Patient denies diarrhea and constipation.  Constitutional:   Patient denies fever, night sweats, weight loss, and fatigue.  Skin:   Patient denies skin rash/ lesion and itching.  Eyes:   Patient denies blurred vision and double vision.  Ears/ Nose/ Throat:   Patient denies sore throat and sinus problems.  Hematologic/Lymphatic:   Patient denies swollen glands and easy bruising.  Cardiovascular:   Patient denies leg swelling and chest pains.  Respiratory:   Patient denies cough and shortness of breath.  Endocrine:   Patient denies excessive thirst.  Musculoskeletal:   Patient denies back pain and joint pain.  Neurological:   Patient denies headaches and dizziness.  Psychologic:   Patient denies depression and anxiety.   VITAL SIGNS:      09/04/2022 01:47 PM  Weight 140 lb / 63.5 kg  Height 66 in / 167.64 cm  BP 177/71 mmHg  Pulse 77 /min  BMI 22.6 kg/m   MULTI-SYSTEM PHYSICAL EXAMINATION:    Constitutional: Well-nourished. No physical deformities. Normally developed. Good grooming.  Neck: Neck symmetrical, not swollen. Normal tracheal position.  Respiratory: No labored breathing, no use of accessory muscles.   Skin: No paleness, no jaundice, no cyanosis. No lesion, no ulcer, no rash.  Neurologic /  Psychiatric: Oriented to time, oriented to place, oriented to person. No depression, no anxiety, no agitation.  Eyes: Normal conjunctivae. Normal eyelids.  Ears, Nose, Mouth, and Throat: Left ear no scars, no lesions, no masses. Right ear no scars, no lesions, no masses. Nose no scars, no lesions, no masses. Normal hearing. Normal lips.  Musculoskeletal: Normal gait and station of head and neck.     Complexity of Data:  Records Review:   Previous Patient Records, POC Tool  Urine Test Review:   Urinalysis   PROCEDURES:         Flexible Cystoscopy - 52000  Risks, benefits, and some of the potential complications of the procedure were discussed at length with the patient including infection, bleeding, voiding discomfort, urinary retention, fever, chills, sepsis, and others. All questions were answered. Informed consent was obtained. Antibiotic prophylaxis was given. Sterile technique and intraurethral analgesia were used.  Meatus:  Normal size. Normal location. Normal condition.  Urethra:  No hypermobility. No leakage.  Ureteral Orifices:  Normal location. Normal size. Normal shape. Effluxed clear urine.  Bladder:  No trabeculation. Papillary bladder mass at left anterior wall approximately 1 cm. No stones. Well-healed prior TUR scar dome      The lower urinary tract was carefully examined. The procedure was well-tolerated and without complications. Antibiotic instructions were given. Instructions were given to call the office  immediately for bloody urine, difficulty urinating, urinary retention, painful or frequent urination, fever, chills, nausea, vomiting or other illness. The patient stated that she understood these instructions and would comply with them.         Urinalysis Dipstick Dipstick Cont'd  Color: Yellow Bilirubin: Neg mg/dL  Appearance: Clear Ketones: Neg mg/dL  Specific Gravity: 1.914 Blood: Neg ery/uL  pH: 6.0 Protein: Neg mg/dL  Glucose: Neg mg/dL Urobilinogen: 0.2 mg/dL     Nitrites: Neg    Leukocyte Esterase: Neg leu/uL    ASSESSMENT:      ICD-10 Details  1 GU:   Bladder Cancer Lateral - C67.2 Chronic, Worsening  2   Mixed incontinence - N39.46 Chronic, Stable   PLAN:           Document Letter(s):  Created for Patient: Clinical Summary         Notes:   Bladder cancer: Cystoscopy today shows what appears to be bladder cancer recurrence with a papillary mass in the left anterior wall  -Risks and benefits of cystoscopy with TURBT and intravesical gemcitabine discussed with the patient in detail including but not limited to pain, bleeding, infection, damage strength structures, need for additional treatment   Patient understand these risks and wishes to proceed she will be scheduled for next available surgery date.   Mixed incontinence: Patient is doing well with pelvic floor physical therapy

## 2022-09-22 NOTE — Anesthesia Preprocedure Evaluation (Addendum)
Anesthesia Evaluation  Patient identified by MRN, date of birth, ID band Patient awake    Reviewed: Allergy & Precautions, H&P , NPO status , Patient's Chart, lab work & pertinent test results  History of Anesthesia Complications (+) PONV and history of anesthetic complications  Airway Mallampati: III  TM Distance: >3 FB Neck ROM: Full    Dental no notable dental hx. (+) Teeth Intact, Dental Advisory Given   Pulmonary neg pulmonary ROS   Pulmonary exam normal breath sounds clear to auscultation       Cardiovascular hypertension, Pt. on medications  Rhythm:Regular Rate:Normal     Neuro/Psych  Headaches  Anxiety Depression       GI/Hepatic negative GI ROS, Neg liver ROS,,,  Endo/Other  Hypothyroidism    Renal/GU negative Renal ROS  negative genitourinary   Musculoskeletal  (+) Arthritis , Osteoarthritis,    Abdominal   Peds  Hematology  (+) Blood dyscrasia, anemia   Anesthesia Other Findings   Reproductive/Obstetrics negative OB ROS                             Anesthesia Physical Anesthesia Plan  ASA: 2  Anesthesia Plan: General   Post-op Pain Management: Tylenol PO (pre-op)*   Induction: Intravenous  PONV Risk Score and Plan: 4 or greater and Ondansetron, Dexamethasone, Propofol infusion and TIVA  Airway Management Planned: Oral ETT  Additional Equipment:   Intra-op Plan:   Post-operative Plan: Extubation in OR  Informed Consent: I have reviewed the patients History and Physical, chart, labs and discussed the procedure including the risks, benefits and alternatives for the proposed anesthesia with the patient or authorized representative who has indicated his/her understanding and acceptance.     Dental advisory given  Plan Discussed with: CRNA  Anesthesia Plan Comments:        Anesthesia Quick Evaluation

## 2022-09-22 NOTE — Discharge Instructions (Signed)

## 2022-09-22 NOTE — Transfer of Care (Signed)
Immediate Anesthesia Transfer of Care Note  Patient: Sheri Mueller  Procedure(s) Performed: TRANSURETHRAL RESECTION OF BLADDER TUMOR (TURBT) with GEMCITABINE  Patient Location: PACU  Anesthesia Type:General  Level of Consciousness: awake, alert , oriented, and patient cooperative  Airway & Oxygen Therapy: Patient Spontanous Breathing  Post-op Assessment: Report given to RN and Post -op Vital signs reviewed and stable  Post vital signs: Reviewed and stable  Last Vitals:  Vitals Value Taken Time  BP 113/52 09/22/22 1111  Temp    Pulse 57 09/22/22 1113  Resp 10 09/22/22 1113  SpO2 98 % 09/22/22 1113  Vitals shown include unvalidated device data.  Last Pain:  Vitals:   09/22/22 0852  TempSrc:   PainSc: 0-No pain         Complications: No notable events documented.

## 2022-09-22 NOTE — Interval H&P Note (Signed)
History and Physical Interval Note:  09/22/2022 10:17 AM  Sheri Mueller  has presented today for surgery, with the diagnosis of BLADDER CANCER.  The various methods of treatment have been discussed with the patient and family. After consideration of risks, benefits and other options for treatment, the patient has consented to  Procedure(s) with comments: TRANSURETHRAL RESECTION OF BLADDER TUMOR (TURBT) with GEMCITABINE (N/A) - 30 MINUTES as a surgical intervention.  The patient's history has been reviewed, patient examined, no change in status, stable for surgery.  I have reviewed the patient's chart and labs.  Questions were answered to the patient's satisfaction.     Amberlin Utke D Irisa Grimsley

## 2022-09-23 LAB — SURGICAL PATHOLOGY

## 2022-09-30 DIAGNOSIS — C672 Malignant neoplasm of lateral wall of bladder: Secondary | ICD-10-CM | POA: Diagnosis not present

## 2022-10-07 DIAGNOSIS — M62838 Other muscle spasm: Secondary | ICD-10-CM | POA: Diagnosis not present

## 2022-10-07 DIAGNOSIS — M6281 Muscle weakness (generalized): Secondary | ICD-10-CM | POA: Diagnosis not present

## 2022-10-07 DIAGNOSIS — M6289 Other specified disorders of muscle: Secondary | ICD-10-CM | POA: Diagnosis not present

## 2022-10-07 DIAGNOSIS — L910 Hypertrophic scar: Secondary | ICD-10-CM | POA: Diagnosis not present

## 2022-11-19 ENCOUNTER — Ambulatory Visit
Admission: RE | Admit: 2022-11-19 | Discharge: 2022-11-19 | Disposition: A | Discharge status: Home / Self Care | Payer: Medicare Other | Source: Ambulatory Visit | Attending: Family Medicine | Admitting: Family Medicine

## 2022-11-19 ENCOUNTER — Other Ambulatory Visit: Payer: Self-pay | Admitting: Family Medicine

## 2022-11-19 DIAGNOSIS — R0781 Pleurodynia: Secondary | ICD-10-CM | POA: Diagnosis not present

## 2022-12-08 DIAGNOSIS — L910 Hypertrophic scar: Secondary | ICD-10-CM | POA: Diagnosis not present

## 2022-12-08 DIAGNOSIS — M6281 Muscle weakness (generalized): Secondary | ICD-10-CM | POA: Diagnosis not present

## 2022-12-08 DIAGNOSIS — M6289 Other specified disorders of muscle: Secondary | ICD-10-CM | POA: Diagnosis not present

## 2022-12-08 DIAGNOSIS — M62838 Other muscle spasm: Secondary | ICD-10-CM | POA: Diagnosis not present

## 2022-12-29 DIAGNOSIS — E039 Hypothyroidism, unspecified: Secondary | ICD-10-CM | POA: Diagnosis not present

## 2022-12-29 DIAGNOSIS — R7989 Other specified abnormal findings of blood chemistry: Secondary | ICD-10-CM | POA: Diagnosis not present

## 2022-12-29 DIAGNOSIS — E559 Vitamin D deficiency, unspecified: Secondary | ICD-10-CM | POA: Diagnosis not present

## 2022-12-29 DIAGNOSIS — R5383 Other fatigue: Secondary | ICD-10-CM | POA: Diagnosis not present

## 2023-01-01 DIAGNOSIS — C672 Malignant neoplasm of lateral wall of bladder: Secondary | ICD-10-CM | POA: Diagnosis not present

## 2023-01-05 DIAGNOSIS — E039 Hypothyroidism, unspecified: Secondary | ICD-10-CM | POA: Diagnosis not present

## 2023-01-14 DIAGNOSIS — E039 Hypothyroidism, unspecified: Secondary | ICD-10-CM | POA: Diagnosis not present

## 2023-01-23 DIAGNOSIS — E039 Hypothyroidism, unspecified: Secondary | ICD-10-CM | POA: Diagnosis not present

## 2023-03-03 DIAGNOSIS — K5730 Diverticulosis of large intestine without perforation or abscess without bleeding: Secondary | ICD-10-CM | POA: Diagnosis not present

## 2023-03-03 DIAGNOSIS — Z1211 Encounter for screening for malignant neoplasm of colon: Secondary | ICD-10-CM | POA: Diagnosis not present

## 2023-04-05 DIAGNOSIS — R5383 Other fatigue: Secondary | ICD-10-CM | POA: Diagnosis not present

## 2023-05-04 DIAGNOSIS — E039 Hypothyroidism, unspecified: Secondary | ICD-10-CM | POA: Diagnosis not present

## 2023-05-04 DIAGNOSIS — E782 Mixed hyperlipidemia: Secondary | ICD-10-CM | POA: Diagnosis not present

## 2023-05-04 DIAGNOSIS — R7989 Other specified abnormal findings of blood chemistry: Secondary | ICD-10-CM | POA: Diagnosis not present

## 2023-05-04 DIAGNOSIS — R5383 Other fatigue: Secondary | ICD-10-CM | POA: Diagnosis not present

## 2023-05-04 DIAGNOSIS — E559 Vitamin D deficiency, unspecified: Secondary | ICD-10-CM | POA: Diagnosis not present

## 2023-05-05 DIAGNOSIS — Z1231 Encounter for screening mammogram for malignant neoplasm of breast: Secondary | ICD-10-CM | POA: Diagnosis not present

## 2023-05-11 DIAGNOSIS — N6001 Solitary cyst of right breast: Secondary | ICD-10-CM | POA: Diagnosis not present

## 2023-05-11 DIAGNOSIS — R922 Inconclusive mammogram: Secondary | ICD-10-CM | POA: Diagnosis not present

## 2023-05-11 DIAGNOSIS — R5383 Other fatigue: Secondary | ICD-10-CM | POA: Diagnosis not present

## 2023-05-21 DIAGNOSIS — I10 Essential (primary) hypertension: Secondary | ICD-10-CM | POA: Diagnosis not present

## 2023-05-21 DIAGNOSIS — M8580 Other specified disorders of bone density and structure, unspecified site: Secondary | ICD-10-CM | POA: Diagnosis not present

## 2023-05-21 DIAGNOSIS — E782 Mixed hyperlipidemia: Secondary | ICD-10-CM | POA: Diagnosis not present

## 2023-05-21 DIAGNOSIS — Z0000 Encounter for general adult medical examination without abnormal findings: Secondary | ICD-10-CM | POA: Diagnosis not present

## 2023-06-17 DIAGNOSIS — M8588 Other specified disorders of bone density and structure, other site: Secondary | ICD-10-CM | POA: Diagnosis not present

## 2023-07-18 ENCOUNTER — Ambulatory Visit
Admission: EM | Admit: 2023-07-18 | Discharge: 2023-07-18 | Disposition: A | Discharge status: Home / Self Care | Attending: Family Medicine | Admitting: Family Medicine

## 2023-07-18 DIAGNOSIS — K047 Periapical abscess without sinus: Secondary | ICD-10-CM

## 2023-07-18 MED ORDER — AMOXICILLIN-POT CLAVULANATE 875-125 MG PO TABS: 1.0000 | ORAL_TABLET | Freq: Two times a day (BID) | ORAL | 0 refills | Status: AC

## 2023-07-18 NOTE — ED Provider Notes (Signed)
 [facility]  Note:  This document was prepared using Conservation officer, historic buildings and may include unintentional dictation errors.  MRN: [id number] DOB: Apr 06, 1951  Subjective:   Sheri Mueller is a 73 y.o. female presenting for 4 to 5-day history of acute onset persistent left-sided dental and gum pain.  Symptoms are worsening with pain and now radiating toward the left side of her face including the eye.  Has noticed some redness and swelling along the gumline.  She did reach out to her dental specialist and had x-rays done.  She was placed on chlorhexidine rinse.  No fever, drainage of pus or bleeding, chest pain, nausea, vomiting, abdominal pain.  Has not lost her ability to eat or drink her normal foods.  However, she does have exquisite tenderness still to the area.  No current facility-administered medications for this encounter.  Current Outpatient Medications:    chlorhexidine (PERIDEX) 0.12 % solution, SWISH 15 ML FOR 30 SECONDS THEN SPIT. USE TWICE DAILY (MORNING AND EVENING) AFTER BRUSHING TEETH, Disp: , Rfl:    ascorbic acid (VITAMIN C) 500 MG tablet, Take 500 mg by mouth 3 (three) times a week., Disp: , Rfl:    Cholecalciferol (VITAMIN D3) 50 MCG (2000 UT) TABS, Take 2,000 Units by mouth daily., Disp: , Rfl:    escitalopram (LEXAPRO) 5 MG tablet, Take 5 mg by mouth daily., Disp: , Rfl:    losartan (COZAAR) 50 MG tablet, Take 50 mg by mouth daily., Disp: , Rfl:    thyroid (NP THYROID) 15 MG tablet, Take 30 mg by mouth daily., Disp: , Rfl:    traMADol (ULTRAM) 50 MG tablet, Take 1 tablet (50 mg total) by mouth every 6 (six) hours as needed., Disp: 20 tablet, Rfl: 0   UNABLE TO FIND, Take 1 capsule by mouth daily. Med Name:estradiol 1 mg, testosterone 1.25 mg and progesterone 50 mg compounded med, Disp: , Rfl:    Allergies  Allergen Reactions   Lisinopril Cough and Other (See Comments)    Dry, scratchy troat     Past Medical History:  Diagnosis  Date   Anemia    past   Anxiety and depression    Arthritis    Hands   Aura    with stress and after flu shot 2018   Bilateral cataracts    Bilateral hip pain    Bladder cancer (HCC)    Bursitis of left elbow    Closed right ankle fracture 01/27/2017   Elevated ferritin    resolved   Headache    History of kidney stones    Hypertension    Hypothyroidism    Insomnia    Ischial bursitis    Left hip   PONV (postoperative nausea and vomiting)    Urinary tract infection      Past Surgical History:  Procedure Laterality Date   APPENDECTOMY  1977   BLADDER SURGERY     tumor removal   BLADDER SURGERY     tumor removal about 30 years   COLONOSCOPY     CYSTOSCOPY     CYSTOSCOPY W/ RETROGRADES Bilateral 11/03/2017   Procedure: BILATERAL  RETROGRADE PYELOGRAM;  Surgeon: Crist Fat, MD;  Location: [facility];  Service: Urology;  Laterality: Bilateral;   FINGER SURGERY  2003   dupuytren's contracture   LASIK  2005   OVARIAN CYST REMOVAL  1982   TRANSURETHRAL RESECTION OF BLADDER TUMOR WITH MITOMYCIN-C N/A 11/03/2017   Procedure: TRANSURETHRAL  RESECTION OF BLADDER TUMOR WITH MITOMYCIN-C;  Surgeon: Crist Fat, MD;  Location: [facility];  Service: Urology;  Laterality: N/A;   VAGINAL HYSTERECTOMY  1990's    Family History  Problem Relation Age of Onset   Congestive Heart Failure Mother    Congestive Heart Failure Maternal Grandmother     Social History   Tobacco Use   Smoking status: Never   Smokeless tobacco: Never  Vaping Use   Vaping status: Never Used  Substance Use Topics   Alcohol use: Yes    Comment: 1 glass wine/daily   Drug use: No    ROS   Objective:   Vitals: BP (!) 149/80 (BP Location: Right Arm)   Pulse 79   Temp 98.9 F (37.2 C) (Oral)   Resp 20   SpO2 98%   Physical Exam Constitutional:      General: She is not in acute distress.    Appearance: Normal appearance. She is well-developed. She  is not ill-appearing, toxic-appearing or diaphoretic.  HENT:     Head: Normocephalic and atraumatic.     Nose: Nose normal.     Mouth/Throat:     Mouth: Mucous membranes are moist.     Comments: Multiple areas of gingival recession throughout.  There is exquisite tenderness with use of the tongue depressor over the anterior lower left gum area around the canines and premolars. Eyes:     General: No scleral icterus.       Right eye: No discharge.        Left eye: No discharge.     Extraocular Movements: Extraocular movements intact.  Cardiovascular:     Rate and Rhythm: Normal rate.  Pulmonary:     Effort: Pulmonary effort is normal.  Skin:    General: Skin is warm and dry.  Neurological:     General: No focal deficit present.     Mental Status: She is alert and oriented to person, place, and time.  Psychiatric:        Mood and Affect: Mood normal.        Behavior: Behavior normal.     Assessment and Plan :   PDMP not reviewed this encounter.  1. Dental infection    Start Augmentin for dental infection/abscess, follow-up closely with her dental specialist. Counseled patient on potential for adverse effects with medications prescribed/recommended today, strict ER and return-to-clinic precautions discussed, patient verbalized understanding.    Wallis Bamberg, [place] 07/18/23 1204

## 2023-07-18 NOTE — ED Triage Notes (Signed)
 Pt states she started having side gum/dental pain that started while on a cruise 8 days-was seen by her dentist and using an oral antiseptic rinse with no relief and pain is worse-NAD-steady gait

## 2023-08-10 DIAGNOSIS — R5383 Other fatigue: Secondary | ICD-10-CM | POA: Diagnosis not present

## 2023-11-18 DIAGNOSIS — R7989 Other specified abnormal findings of blood chemistry: Secondary | ICD-10-CM | POA: Diagnosis not present

## 2023-11-18 DIAGNOSIS — E782 Mixed hyperlipidemia: Secondary | ICD-10-CM | POA: Diagnosis not present

## 2023-11-18 DIAGNOSIS — E559 Vitamin D deficiency, unspecified: Secondary | ICD-10-CM | POA: Diagnosis not present

## 2023-11-18 DIAGNOSIS — E039 Hypothyroidism, unspecified: Secondary | ICD-10-CM | POA: Diagnosis not present

## 2023-11-18 DIAGNOSIS — R5383 Other fatigue: Secondary | ICD-10-CM | POA: Diagnosis not present
